# Patient Record
Sex: Female | Born: 1990 | Race: Black or African American | Hispanic: No | Marital: Single | Smoking: Former smoker
Health system: Southern US, Community
[De-identification: ages and names within clinical notes are randomized; demographics above are authoritative.]

## PROBLEM LIST (undated history)

## (undated) DIAGNOSIS — O26899 Other specified pregnancy related conditions, unspecified trimester: Secondary | ICD-10-CM

## (undated) DIAGNOSIS — L732 Hidradenitis suppurativa: Secondary | ICD-10-CM

## (undated) DIAGNOSIS — IMO0002 Reserved for concepts with insufficient information to code with codable children: Secondary | ICD-10-CM

## (undated) DIAGNOSIS — Z973 Presence of spectacles and contact lenses: Secondary | ICD-10-CM

## (undated) DIAGNOSIS — G43909 Migraine, unspecified, not intractable, without status migrainosus: Secondary | ICD-10-CM

## (undated) DIAGNOSIS — R12 Heartburn: Secondary | ICD-10-CM

## (undated) HISTORY — DX: Presence of spectacles and contact lenses: Z97.3

## (undated) HISTORY — DX: Migraine, unspecified, not intractable, without status migrainosus: G43.909

---

## 2009-09-06 ENCOUNTER — Emergency Department (HOSPITAL_COMMUNITY): Admission: EM | Admit: 2009-09-06 | Discharge: 2009-09-06 | Payer: Self-pay | Admitting: Family Medicine

## 2009-09-06 ENCOUNTER — Emergency Department (HOSPITAL_COMMUNITY): Admission: EM | Admit: 2009-09-06 | Discharge: 2009-09-07 | Payer: Self-pay | Admitting: Emergency Medicine

## 2010-10-05 LAB — POCT URINALYSIS DIP (DEVICE)
Glucose, UA: NEGATIVE mg/dL
Ketones, ur: >=160 mg/dL — AB
Nitrite: NEGATIVE
Specific Gravity, Urine: 1.02 (ref 1.005–1.030)
Urobilinogen, UA: 1 mg/dL (ref 0.0–1.0)

## 2010-10-05 LAB — WET PREP, GENITAL: Trich, Wet Prep: NONE SEEN

## 2010-10-05 LAB — ABO/RH: ABO/RH(D): B POS

## 2010-10-05 LAB — CBC: MCV: 89.5 fL (ref 78.0–100.0)

## 2010-10-05 LAB — GC/CHLAMYDIA PROBE AMP, GENITAL
Chlamydia, DNA Probe: NEGATIVE
GC Probe Amp, Genital: NEGATIVE

## 2010-10-05 LAB — POCT RAPID STREP A (OFFICE): Streptococcus, Group A Screen (Direct): POSITIVE — AB

## 2013-05-12 ENCOUNTER — Ambulatory Visit (INDEPENDENT_AMBULATORY_CARE_PROVIDER_SITE_OTHER): Payer: PRIVATE HEALTH INSURANCE | Admitting: Medical

## 2013-05-12 ENCOUNTER — Encounter: Payer: Self-pay | Admitting: Medical

## 2013-05-12 VITALS — BP 110/78 | HR 68 | Temp 97.9°F | Resp 16 | Wt 154.0 lb

## 2013-05-12 DIAGNOSIS — N939 Abnormal uterine and vaginal bleeding, unspecified: Secondary | ICD-10-CM

## 2013-05-12 DIAGNOSIS — Z3201 Encounter for pregnancy test, result positive: Secondary | ICD-10-CM

## 2013-05-12 DIAGNOSIS — N898 Other specified noninflammatory disorders of vagina: Secondary | ICD-10-CM

## 2013-05-12 DIAGNOSIS — N926 Irregular menstruation, unspecified: Secondary | ICD-10-CM

## 2013-05-12 DIAGNOSIS — R112 Nausea with vomiting, unspecified: Secondary | ICD-10-CM

## 2013-05-12 LAB — POCT URINE PREGNANCY: Preg Test, Ur: POSITIVE

## 2013-05-12 LAB — POCT URINALYSIS DIPSTICK
Bilirubin, UA: NEGATIVE
Glucose, UA: 100
Spec Grav, UA: 1.02

## 2013-05-12 MED ORDER — PRENATAL MULTIVITAMIN CH: 1.0000 | ORAL_TABLET | Freq: Every day | ORAL | Status: DC

## 2013-05-12 NOTE — Addendum Note (Signed)
Addended by: Leretha Dykes L on: 05/12/2013 12:03 PM   Modules accepted: Orders

## 2013-05-12 NOTE — Progress Notes (Signed)
Subjective:  Candace Pearson is a 22 y.o. female who presents as a new patient.  She is accompanied today by numerous family members including her fianc of several years. She is concerned that she may be pregnant. She took a home pregnancy test last week and it was positive .  She is not completely sure of her last menstrual period, believes it was about a month ago.  Current symptoms include nausea, morning sickness feeling, vomiting, breast tenderness, some weight gain, and some slight vaginal spotting.  She is sexually active.  2 months ago she was having some heavy menstrual bleeding with clots, thinks she may have uterine fibroids, although she has never been diagnosed or had an ultrasound. Otherwise has been in her usual state of health.  She notes a history of 2 prior pregnancies, both abortions, one D&C, the other through tablet medication.  However at this time she is excited about potentially being pregnant, plans to keep this child.  She is in school at [employer], Junior year. She is originally from [place], lived in [place] for a while. No other aggravating or relieving factors.    No other c/o.  The following portions of the patient's history were reviewed and updated as appropriate: allergies, current medications, past family history, past medical history, past social history, past surgical history and problem list.  ROS Otherwise as in subjective above  Objective: Physical Exam  BP 110/78  Pulse 68  Temp(Src) 97.9 F (36.6 C) (Oral)  Resp 16  Wt 154 lb (69.854 kg)  General appearance: alert, no distress, WD/WN, pleasant AA female Neck: supple, no lymphadenopathy, no thyromegaly, no masses Heart: RRR, normal S1, S2, no murmurs Lungs: CTA bilaterally, no wheezes, rhonchi, or rales Abdomen: +bs, soft, mild generalized abdominal tenderness, non distended, no masses, no hepatomegaly, no splenomegaly Pulses: 2+ radial pulses, 2+ pedal pulses, normal cap refill Ext: no  edema   Assessment: Encounter Diagnoses  Name Primary?  . Pregnancy test positive Yes  . Missed period   . Nausea with vomiting   . Vaginal spotting     Plan: Urine pregnancy test positive here today.  At her request discussed results with patient and her family and friends who are present.  We will check a beta hCG. I advise she do all she can to keep herself healthy and avoid any harm to the developing embryo.   Advise she stop alcohol, marijuana, tobacco.  Advise healthy diet, begin prenatal vitamins, discussed food preparation, avoiding raw foods, processed meats, eating healthy frequent small meals, advise she avoid litter boxes, wash hands frequently use good hygiene in general.   Recommend she read the book "what to expect when you're expecting," discussed family support.  We referred her to obstetrician, and I gave her these days today. Answered her questions. Handout given.

## 2013-05-12 NOTE — Patient Instructions (Signed)

## 2013-06-02 LAB — OB RESULTS CONSOLE HEPATITIS B SURFACE ANTIGEN: HEP B S AG: NEGATIVE

## 2013-06-02 LAB — OB RESULTS CONSOLE ABO/RH: RH Type: POSITIVE

## 2013-06-02 LAB — OB RESULTS CONSOLE HIV ANTIBODY (ROUTINE TESTING): HIV: NONREACTIVE

## 2013-06-02 LAB — OB RESULTS CONSOLE ANTIBODY SCREEN: ANTIBODY SCREEN: NEGATIVE

## 2013-06-02 LAB — OB RESULTS CONSOLE RUBELLA ANTIBODY, IGM: RUBELLA: IMMUNE

## 2013-06-04 ENCOUNTER — Telehealth: Payer: Self-pay | Admitting: Internal Medicine

## 2013-06-04 NOTE — Telephone Encounter (Signed)
Faxed over medical records to [facility] on 11/20

## 2013-09-29 ENCOUNTER — Emergency Department (HOSPITAL_COMMUNITY)
Admission: EM | Admit: 2013-09-29 | Discharge: 2013-09-30 | Disposition: A | Discharge status: Home / Self Care | Payer: PPO | Attending: Emergency Medicine | Admitting: Emergency Medicine

## 2013-09-29 ENCOUNTER — Encounter (HOSPITAL_COMMUNITY): Payer: Self-pay | Admitting: Emergency Medicine

## 2013-09-29 DIAGNOSIS — IMO0002 Reserved for concepts with insufficient information to code with codable children: Secondary | ICD-10-CM | POA: Insufficient documentation

## 2013-09-29 DIAGNOSIS — Z8679 Personal history of other diseases of the circulatory system: Secondary | ICD-10-CM | POA: Insufficient documentation

## 2013-09-29 DIAGNOSIS — L02412 Cutaneous abscess of left axilla: Secondary | ICD-10-CM

## 2013-09-29 DIAGNOSIS — L732 Hidradenitis suppurativa: Secondary | ICD-10-CM | POA: Insufficient documentation

## 2013-09-29 DIAGNOSIS — O9989 Other specified diseases and conditions complicating pregnancy, childbirth and the puerperium: Secondary | ICD-10-CM | POA: Insufficient documentation

## 2013-09-29 NOTE — ED Notes (Addendum)
Pt in c/o abscess to left axillary area, increased pain and swelling today, states pain is radiating into her left breast and down her side now, no redness noted beyond site of abscess- pt is also [redacted] weeks pregnant, denies problems with pregnancy, no vaginal discharge or bleeding, denies abdominal pain

## 2013-09-29 NOTE — ED Notes (Signed)
Fetal heart tones obtained at triage, range of 155-165 noted.

## 2013-09-30 MED ORDER — ACETAMINOPHEN 325 MG PO TABS
650.0000 mg | ORAL_TABLET | Freq: Once | ORAL | Status: AC
  Administered 2013-09-30: 650 mg via ORAL
  Filled 2013-09-30: qty 2

## 2013-09-30 MED ORDER — LIDOCAINE HCL (PF) 1 % IJ SOLN
5.0000 mL | Freq: Once | INTRAMUSCULAR | Status: DC
  Filled 2013-09-30: qty 5

## 2013-09-30 NOTE — Discharge Instructions (Signed)
Dressing can be removed in the morning.  Shower twice daily with antibacterial soap.  Gently let the water run over the area.  Do not scrub.  Followup in the emergency department, urgent care or with your primary care Dr. for recheck in 2 days.  Return sooner for signs of infection: Redness, worsening swelling, fever.  Return also for other new concerning symptoms.  You may take Tylenol for pain.     Abscess Care After An abscess (also called a boil or furuncle) is an infected area that contains a collection of pus. Signs and symptoms of an abscess include pain, tenderness, redness, or hardness, or you may feel a moveable soft area under your skin. An abscess can occur anywhere in the body. The infection may spread to surrounding tissues causing cellulitis. A cut (incision) by the surgeon was made over your abscess and the pus was drained out. Gauze may have been packed into the space to provide a drain that will allow the cavity to heal from the inside outwards. The boil may be painful for 5 to 7 days. Most people with a boil do not have high fevers. Your abscess, if seen early, may not have localized, and may not have been lanced. If not, another appointment may be required for this if it does not get better on its own or with medications. HOME CARE INSTRUCTIONS   Only take over-the-counter or prescription medicines for pain, discomfort, or fever as directed by your caregiver.  When you bathe, soak and then remove gauze or iodoform packs at least daily or as directed by your caregiver. You may then wash the wound gently with mild soapy water. Repack with gauze or do as your caregiver directs. SEEK IMMEDIATE MEDICAL CARE IF:   You develop increased pain, swelling, redness, drainage, or bleeding in the wound site.  You develop signs of generalized infection including muscle aches, chills, fever, or a general ill feeling.  An oral temperature above 102 F (38.9 C) develops, not controlled by  medication. See your caregiver for a recheck if you develop any of the symptoms described above. If medications (antibiotics) were prescribed, take them as directed. Document Released: 01/17/2005 Document Revised: 09/23/2011 Document Reviewed: 09/14/2007 [facility] Patient Information 2014 [place].  Hidradenitis Suppurativa, Sweat Gland Abscess Hidradenitis suppurativa is a long lasting (chronic), uncommon disease of the sweat glands. With this, boil-like lumps and scarring develop in the groin, some times under the arms (axillae), and under the breasts. It may also uncommonly occur behind the ears, in the crease of the buttocks, and around the genitals.  CAUSES  The cause is from a blocking of the sweat glands. They then become infected. It may cause drainage and odor. It is not contagious. So it cannot be given to someone else. It most often shows up in puberty (about 3010 to 25 years of age). But it may happen much later. It is similar to acne which is a disease of the sweat glands. This condition is slightly more common in African-Americans and women. SYMPTOMS   Hidradenitis usually starts as one or more red, tender, swellings in the groin or under the arms (axilla).  Over a period of hours to days the lesions get larger. They often open to the skin surface, draining clear to yellow-colored fluid.  The infected area heals with scarring. DIAGNOSIS  Your caregiver makes this diagnosis by looking at you. Sometimes cultures (growing germs on plates in the lab) may be taken. This is to see  what germ (bacterium) is causing the infection.  TREATMENT   Topical germ killing medicine applied to the skin (antibiotics) are the treatment of choice. Antibiotics taken by mouth (systemic) are sometimes needed when the condition is getting worse or is severe.  Avoid tight-fitting clothing which traps moisture in.  Dirt does not cause hidradenitis and it is not caused by poor hygiene.  Involved areas  should be cleaned daily using an antibacterial soap. Some patients find that the liquid form of Lever 2000, applied to the involved areas as a lotion after bathing, can help reduce the odor related to this condition.  Sometimes surgery is needed to drain infected areas or remove scarred tissue. Removal of large amounts of tissue is used only in severe cases.  Birth control pills may be helpful.  Oral retinoids (vitamin A derivatives) for 6 to 12 months which are effective for acne may also help this condition.  Weight loss will improve but not cure hidradenitis. It is made worse by being overweight. But the condition is not caused by being overweight.  This condition is more common in people who have had acne.  It may become worse under stress. There is no medical cure for hidradenitis. It can be controlled, but not cured. The condition usually continues for years with periods of getting worse and getting better (remission). Document Released: 02/13/2004 Document Revised: 09/23/2011 Document Reviewed: 02/29/2008 [facility] Patient Information 2014 [place], [place].

## 2013-09-30 NOTE — ED Provider Notes (Signed)
CSN: [id number]     Arrival date & time 09/29/13  1853 History   First MD Initiated Contact with Patient 09/30/13 0016     Chief Complaint  Patient presents with  . Abscess     (Consider location/radiation/quality/duration/timing/severity/associated sxs/prior Treatment) HPI 23 year old female presents to the emergency department with complaint of left axillary abscess.  She reports swelling to the area.  Over the last several weeks, over the last 5 days.  Has gotten larger and more tender.  She has been doing warm compresses without improvement.  No prior history of hidradenitis.  Patient is currently [redacted] weeks pregnant.  She reports no problems with the pregnancy.  The pain in axilla radiates down her left arm and into her breast.  No fevers or chills. Past Medical History  Diagnosis Date  . Migraine   . Wears glasses    History reviewed. No pertinent past surgical history. History reviewed. No pertinent family history. History  Substance Use Topics  . Smoking status: Never Smoker   . Smokeless tobacco: Not on file  . Alcohol Use: Not on file     Comment: usually mixture of alcohol, but none currently   OB History   Grav Para Term Preterm Abortions TAB SAB Ect Mult Living                 Review of Systems  See History of Present Illness; otherwise all other systems are reviewed and negative   Allergies  Review of patient's allergies indicates no known allergies.  Home Medications   Current Outpatient Rx  Name  Route  Sig  Dispense  Refill  . Prenatal Vit-Fe Fumarate-FA (PRENATAL MULTIVITAMIN) TABS tablet   Oral   Take 1 tablet by mouth daily at 12 noon.   30 tablet   11    BP 108/72  Pulse 86  Temp(Src) 98.1 F (36.7 C) (Oral)  Resp 18  SpO2 100% Physical Exam  Nursing note and vitals reviewed. Constitutional: She is oriented to person, place, and time. She appears well-developed and well-nourished. She appears distressed (uncomfortable appearing).  HENT:   Head: Normocephalic and atraumatic.  Nose: Nose normal.  Mouth/Throat: Oropharynx is clear and moist.  Eyes: Conjunctivae and EOM are normal. Pupils are equal, round, and reactive to light.  Neck: Normal range of motion. Neck supple. No JVD present. No tracheal deviation present. No thyromegaly present.  Cardiovascular: Normal rate, regular rhythm, normal heart sounds and intact distal pulses.  Exam reveals no gallop and no friction rub.   No murmur heard. Pulmonary/Chest: Effort normal and breath sounds normal. No stridor. No respiratory distress. She has no wheezes. She has no rales. She exhibits no tenderness.  Abdominal: Soft. Bowel sounds are normal. She exhibits no distension and no mass. There is no tenderness. There is no rebound and no guarding.  Gravid uterus  Musculoskeletal: Normal range of motion. She exhibits tenderness. She exhibits no edema.  Lymphadenopathy:    She has no cervical adenopathy.  Neurological: She is alert and oriented to person, place, and time. She exhibits normal muscle tone. Coordination normal.  Skin: Skin is warm and dry. No rash noted. No erythema. No pallor.  Patient has skin changes in left axilla, consistent with hidradenitis.  She has a large, fluctuant area in the center of her axilla with pointing laterally.  This area is very tender to palpation.  There is no surrounding erythema or warmth outside of the fluctuant area.  Psychiatric: She has a normal mood  and affect. Her behavior is normal. Judgment and thought content normal.    ED Course  Procedures (including critical care time) Labs Review Labs Reviewed  CULTURE, ROUTINE-ABSCESS   Imaging Review No results found.   EKG Interpretation None      INCISION AND DRAINAGE Performed by: Olivia MackieTTER,Brode Sculley M Consent: Verbal consent obtained. Risks and benefits: risks, benefits and alternatives were discussed Type: abscess  Body area: left axilla  Anesthesia: local infiltration  Incision was  made with a scalpel.  Local anesthetic: lidocaine 1% without epinephrine  Anesthetic total: 5 ml  Complexity: complex Blunt dissection to break up loculations  Drainage: purulent  Drainage amount: moderate  Packing material: none  Patient tolerance: Patient tolerated the procedure well with no immediate complications.     MDM   Final diagnoses:  Abscess of axilla, left  Hidradenitis    23 year old female with left axilla abscess.  I suspect patient does have some underlying hidradenitis.  Patient advised that after her pregnancy, if she continues having problems.  She should call for general surgery for further evaluation.      Olivia Mackielga M Arther Heisler, MD 09/30/13 [phone]

## 2013-10-02 ENCOUNTER — Encounter (HOSPITAL_COMMUNITY): Payer: Self-pay | Admitting: Emergency Medicine

## 2013-10-02 ENCOUNTER — Emergency Department (INDEPENDENT_AMBULATORY_CARE_PROVIDER_SITE_OTHER)
Admission: EM | Admit: 2013-10-02 | Discharge: 2013-10-02 | Disposition: A | Discharge status: Home / Self Care | Payer: 59 | Source: Home / Self Care | Attending: Family Medicine | Admitting: Family Medicine

## 2013-10-02 DIAGNOSIS — IMO0002 Reserved for concepts with insufficient information to code with codable children: Secondary | ICD-10-CM

## 2013-10-02 DIAGNOSIS — L02419 Cutaneous abscess of limb, unspecified: Secondary | ICD-10-CM

## 2013-10-02 DIAGNOSIS — Z5189 Encounter for other specified aftercare: Secondary | ICD-10-CM

## 2013-10-02 HISTORY — DX: Hidradenitis suppurativa: L73.2

## 2013-10-02 MED ORDER — SODIUM CHLORIDE 0.9 % IJ SOLN
INTRAMUSCULAR | Status: AC
  Filled 2013-10-02: qty 10

## 2013-10-02 NOTE — ED Notes (Signed)
Was seen in ED 2 days ago for left axillary abscess - had I&D with packing placed.  Was not placed on abx.  Pt is 6 months pregnant.  Has not been taking anything for pain.  C/O significant burning & pain to area.

## 2013-10-02 NOTE — Discharge Instructions (Signed)
Abscess  Care After  An abscess (also called a boil or furuncle) is an infected area that contains a collection of pus. Signs and symptoms of an abscess include pain, tenderness, redness, or hardness, or you may feel a moveable soft area under your skin. An abscess can occur anywhere in the body. The infection may spread to surrounding tissues causing cellulitis. A cut (incision) by the surgeon was made over your abscess and the pus was drained out. Gauze may have been packed into the space to provide a drain that will allow the cavity to heal from the inside outwards. The boil may be painful for 5 to 7 days. Most people with a boil do not have high fevers. Your abscess, if seen early, may not have localized, and may not have been lanced. If not, another appointment may be required for this if it does not get better on its own or with medications.  HOME CARE INSTRUCTIONS   · Only take over-the-counter or prescription medicines for pain, discomfort, or fever as directed by your caregiver.  · When you bathe, soak and then remove gauze or iodoform packs at least daily or as directed by your caregiver. You may then wash the wound gently with mild soapy water. Repack with gauze or do as your caregiver directs.  SEEK IMMEDIATE MEDICAL CARE IF:   · You develop increased pain, swelling, redness, drainage, or bleeding in the wound site.  · You develop signs of generalized infection including muscle aches, chills, fever, or a general ill feeling.  · An oral temperature above 102° F (38.9° C) develops, not controlled by medication.  See your caregiver for a recheck if you develop any of the symptoms described above. If medications (antibiotics) were prescribed, take them as directed.  Document Released: 01/17/2005 Document Revised: 09/23/2011 Document Reviewed: 09/14/2007  Exit[employer]® Patient Information ©2014 [employer], [employer].

## 2013-10-02 NOTE — ED Provider Notes (Signed)
CSN: [id number]     Arrival date & time 10/02/13  1001 History   First MD Initiated Contact with Patient 10/02/13 1036     Chief Complaint  Patient presents with  . Wound Check   (Consider location/radiation/quality/duration/timing/severity/associated sxs/prior Treatment) HPI Comments: 23 year old female presents for wound check for a left axillary abscess. She had this drained in the emergency Department couple of days ago. The pain is much better since getting this drained. Denies any systemic symptoms. She notes she is currently 6 months pregnant.  Patient is a 23 y.o. female presenting with wound check.  Wound Check Pertinent negatives include no chest pain, no abdominal pain and no shortness of breath.    Past Medical History  Diagnosis Date  . Migraine   . Wears glasses   . Hidradenitis    History reviewed. No pertinent past surgical history. No family history on file. History  Substance Use Topics  . Smoking status: Never Smoker   . Smokeless tobacco: Not on file  . Alcohol Use: No   OB History   Grav Para Term Preterm Abortions TAB SAB Ect Mult Living   1              Review of Systems  Constitutional: Negative for fever and chills.  Eyes: Negative for visual disturbance.  Respiratory: Negative for cough and shortness of breath.   Cardiovascular: Negative for chest pain, palpitations and leg swelling.  Gastrointestinal: Negative for nausea, vomiting and abdominal pain.  Endocrine: Negative for polydipsia and polyuria.  Genitourinary: Negative for dysuria, urgency and frequency.  Musculoskeletal: Negative for arthralgias and myalgias.  Skin: Positive for wound. Negative for rash.  Neurological: Negative for dizziness, weakness and light-headedness.    Allergies  Review of patient's allergies indicates no known allergies.  Home Medications   Current Outpatient Rx  Name  Route  Sig  Dispense  Refill  . Prenatal Vit-Fe Fumarate-FA (PRENATAL MULTIVITAMIN) TABS  tablet   Oral   Take 1 tablet by mouth daily at 12 noon.   30 tablet   11    BP 117/79  Pulse 85  Temp(Src) 98.4 F (36.9 C) (Oral)  Resp 16  SpO2 99% Physical Exam  Nursing note and vitals reviewed. Constitutional: She is oriented to person, place, and time. Vital signs are normal. She appears well-developed and well-nourished. No distress.  HENT:  Head: Normocephalic and atraumatic.  Pulmonary/Chest: Effort normal. No respiratory distress.  Neurological: She is alert and oriented to person, place, and time. She has normal strength. Coordination normal.  Skin: Skin is warm and dry. No rash noted. She is not diaphoretic.  Abscess in the left axilla, wound dressed, with bloody drainage, with very small wick placed into the abscess cavity. There is minimal induration superiorly and medially to the incision. No fluctuance or further purulent drainage. No erythema surrounding this.  Psychiatric: She has a normal mood and affect. Judgment normal.    ED Course  Procedures (including critical care time) Labs Review Labs Reviewed - No data to display Imaging Review No results found.   MDM   1. Abscess, axilla   2. Wound check, abscess    Patient is afebrile and nontoxic, which improved her symptoms and drainage. Very minimal abscess cavity. Will not repack this. Warm soaks at least 3-4 times daily, followup immediately if any worsening.       Graylon GoodZachary H Keena Heesch, PA-C 10/02/13 1054

## 2013-10-03 LAB — CULTURE, ROUTINE-ABSCESS

## 2013-10-04 NOTE — ED Provider Notes (Signed)
Medical screening examination/treatment/procedure(s) were performed by a resident physician or non-physician practitioner and as the supervising physician I was immediately available for consultation/collaboration.  Shanyla Marconi, MD    Cina Klumpp S Orvie Caradine, MD 10/04/13 0802 

## 2013-11-11 ENCOUNTER — Other Ambulatory Visit: Payer: Self-pay | Admitting: Obstetrics and Gynecology

## 2013-11-11 DIAGNOSIS — O358XX0 Maternal care for other (suspected) fetal abnormality and damage, not applicable or unspecified: Secondary | ICD-10-CM

## 2013-11-23 ENCOUNTER — Ambulatory Visit (HOSPITAL_COMMUNITY): Payer: 59

## 2013-12-03 ENCOUNTER — Ambulatory Visit (HOSPITAL_COMMUNITY)
Admission: RE | Admit: 2013-12-03 | Discharge: 2013-12-03 | Disposition: A | Discharge status: Home / Self Care | Payer: 59 | Source: Ambulatory Visit | Attending: Obstetrics and Gynecology | Admitting: Obstetrics and Gynecology

## 2013-12-03 ENCOUNTER — Encounter (HOSPITAL_COMMUNITY): Payer: Self-pay

## 2013-12-03 ENCOUNTER — Other Ambulatory Visit: Payer: Self-pay | Admitting: Obstetrics and Gynecology

## 2013-12-03 ENCOUNTER — Ambulatory Visit (HOSPITAL_COMMUNITY): Admission: RE | Admit: 2013-12-03 | Payer: 59 | Source: Ambulatory Visit

## 2013-12-03 DIAGNOSIS — O358XX0 Maternal care for other (suspected) fetal abnormality and damage, not applicable or unspecified: Secondary | ICD-10-CM | POA: Insufficient documentation

## 2013-12-23 ENCOUNTER — Encounter (HOSPITAL_COMMUNITY): Payer: Self-pay | Admitting: Pharmacist

## 2013-12-23 ENCOUNTER — Other Ambulatory Visit: Payer: Self-pay | Admitting: Obstetrics and Gynecology

## 2013-12-24 ENCOUNTER — Encounter (HOSPITAL_COMMUNITY): Payer: Self-pay

## 2013-12-24 NOTE — Patient Instructions (Addendum)
   Your procedure is scheduled on:  Tuesday,  June 15  [facility] at:  10 AM Pick up the phone at the desk and dial [phone] and inform us of your arrival.  Please call this number if you have any problems the morning of surgery: [phone]  Remember: Do not eat food after midnight: Monday Do not drink clear liquids after: 7 AM Tuesday Take these medicines the morning of surgery with a SIP OF WATER:  None  Do not wear jewelry, make-up, or FINGER nail polish No metal in your hair or on your body. Do not wear lotions, powders, perfumes.  You may wear deodorant.  Do not bring valuables to the hospital. Contacts, dentures or bridgework may not be worn into surgery.  Leave suitcase in the car. After Surgery it may be brought to your room. For patients being admitted to the hospital, checkout time is 11:00am the day of discharge.  Home with FOB Sean cell [phone].

## 2013-12-27 ENCOUNTER — Encounter (HOSPITAL_COMMUNITY): Payer: Self-pay

## 2013-12-27 ENCOUNTER — Encounter (HOSPITAL_COMMUNITY)
Admission: RE | Admit: 2013-12-27 | Discharge: 2013-12-27 | Disposition: A | Discharge status: Home / Self Care | Payer: 59 | Source: Ambulatory Visit | Attending: Obstetrics and Gynecology | Admitting: Obstetrics and Gynecology

## 2013-12-27 HISTORY — DX: Heartburn: R12

## 2013-12-27 HISTORY — DX: Other specified pregnancy related conditions, unspecified trimester: O26.899

## 2013-12-27 HISTORY — DX: Reserved for concepts with insufficient information to code with codable children: IMO0002

## 2013-12-27 LAB — TYPE AND SCREEN
ABO/RH(D): B POS
ANTIBODY SCREEN: NEGATIVE

## 2013-12-27 LAB — CBC
HCT: 36 % (ref 36.0–46.0)
Hemoglobin: 12.4 g/dL (ref 12.0–15.0)
MCH: 31.4 pg (ref 26.0–34.0)
MCHC: 34.4 g/dL (ref 30.0–36.0)
MCV: 91.1 fL (ref 78.0–100.0)
PLATELETS: 179 10*3/uL (ref 150–400)
RBC: 3.95 MIL/uL (ref 3.87–5.11)
RDW: 12.9 % (ref 11.5–15.5)
WBC: 9 10*3/uL (ref 4.0–10.5)

## 2013-12-27 LAB — ABO/RH: ABO/RH(D): B POS

## 2013-12-27 LAB — RPR

## 2013-12-28 ENCOUNTER — Inpatient Hospital Stay (HOSPITAL_COMMUNITY): Payer: 59 | Admitting: Anesthesiology

## 2013-12-28 ENCOUNTER — Encounter (HOSPITAL_COMMUNITY)
Admission: RE | Disposition: A | Discharge status: Home / Self Care | Payer: Self-pay | Source: Ambulatory Visit | Attending: Obstetrics and Gynecology

## 2013-12-28 ENCOUNTER — Inpatient Hospital Stay (HOSPITAL_COMMUNITY)
Admission: RE | Admit: 2013-12-28 | Discharge: 2013-12-31 | DRG: 766 | Disposition: A | Discharge status: Home / Self Care | Payer: 59 | Source: Ambulatory Visit | Attending: Obstetrics and Gynecology | Admitting: Obstetrics and Gynecology

## 2013-12-28 ENCOUNTER — Encounter (HOSPITAL_COMMUNITY): Payer: Self-pay | Admitting: *Deleted

## 2013-12-28 ENCOUNTER — Encounter (HOSPITAL_COMMUNITY): Payer: 59 | Admitting: Anesthesiology

## 2013-12-28 DIAGNOSIS — O99344 Other mental disorders complicating childbirth: Secondary | ICD-10-CM | POA: Diagnosis present

## 2013-12-28 DIAGNOSIS — O26849 Uterine size-date discrepancy, unspecified trimester: Secondary | ICD-10-CM | POA: Diagnosis present

## 2013-12-28 DIAGNOSIS — Z87891 Personal history of nicotine dependence: Secondary | ICD-10-CM

## 2013-12-28 DIAGNOSIS — O321XX0 Maternal care for breech presentation, not applicable or unspecified: Principal | ICD-10-CM | POA: Diagnosis present

## 2013-12-28 DIAGNOSIS — Z98891 History of uterine scar from previous surgery: Secondary | ICD-10-CM

## 2013-12-28 DIAGNOSIS — M4120 Other idiopathic scoliosis, site unspecified: Secondary | ICD-10-CM | POA: Diagnosis present

## 2013-12-28 DIAGNOSIS — F1210 Cannabis abuse, uncomplicated: Secondary | ICD-10-CM | POA: Diagnosis present

## 2013-12-28 DIAGNOSIS — O358XX0 Maternal care for other (suspected) fetal abnormality and damage, not applicable or unspecified: Secondary | ICD-10-CM | POA: Diagnosis present

## 2013-12-28 SURGERY — Surgical Case
Anesthesia: Spinal

## 2013-12-28 MED ORDER — PHENYLEPHRINE 8 MG IN D5W 100 ML (0.08MG/ML) PREMIX OPTIME
INJECTION | INTRAVENOUS | Status: DC | PRN
  Administered 2013-12-28: 60 ug/min via INTRAVENOUS

## 2013-12-28 MED ORDER — BUPIVACAINE IN DEXTROSE 0.75-8.25 % IT SOLN
INTRATHECAL | Status: DC | PRN
  Administered 2013-12-28: 1.6 mL via INTRATHECAL

## 2013-12-28 MED ORDER — PROMETHAZINE HCL 25 MG/ML IJ SOLN: 6.2500 mg | INTRAMUSCULAR | Status: DC | PRN

## 2013-12-28 MED ORDER — OXYCODONE-ACETAMINOPHEN 5-325 MG PO TABS
1.0000 | ORAL_TABLET | ORAL | Status: DC | PRN
  Administered 2013-12-29 – 2013-12-30 (×7): 1 via ORAL
  Administered 2013-12-31 (×3): 2 via ORAL
  Filled 2013-12-28 (×4): qty 1
  Filled 2013-12-28: qty 2
  Filled 2013-12-28: qty 1
  Filled 2013-12-28: qty 2
  Filled 2013-12-28 (×4): qty 1

## 2013-12-28 MED ORDER — MORPHINE SULFATE 0.5 MG/ML IJ SOLN
INTRAMUSCULAR | Status: AC
  Filled 2013-12-28: qty 10

## 2013-12-28 MED ORDER — DIPHENHYDRAMINE HCL 25 MG PO CAPS: 25.0000 mg | ORAL_CAPSULE | ORAL | Status: DC | PRN

## 2013-12-28 MED ORDER — FERROUS SULFATE 325 (65 FE) MG PO TABS
325.0000 mg | ORAL_TABLET | Freq: Two times a day (BID) | ORAL | Status: DC
  Administered 2013-12-29 – 2013-12-30 (×4): 325 mg via ORAL
  Filled 2013-12-28 (×4): qty 1

## 2013-12-28 MED ORDER — SCOPOLAMINE 1 MG/3DAYS TD PT72: 1.0000 | MEDICATED_PATCH | Freq: Once | TRANSDERMAL | Status: DC

## 2013-12-28 MED ORDER — FLEET ENEMA 7-19 GM/118ML RE ENEM: 1.0000 | ENEMA | Freq: Every day | RECTAL | Status: DC | PRN

## 2013-12-28 MED ORDER — MIDAZOLAM HCL 2 MG/2ML IJ SOLN: 0.5000 mg | Freq: Once | INTRAMUSCULAR | Status: DC | PRN

## 2013-12-28 MED ORDER — PRENATAL MULTIVITAMIN CH
1.0000 | ORAL_TABLET | Freq: Every day | ORAL | Status: DC
  Administered 2013-12-29 – 2013-12-30 (×2): 1 via ORAL
  Filled 2013-12-28 (×2): qty 1

## 2013-12-28 MED ORDER — MEPERIDINE HCL 25 MG/ML IJ SOLN: 6.2500 mg | INTRAMUSCULAR | Status: DC | PRN

## 2013-12-28 MED ORDER — KETOROLAC TROMETHAMINE 30 MG/ML IJ SOLN
30.0000 mg | Freq: Four times a day (QID) | INTRAMUSCULAR | Status: AC | PRN
  Administered 2013-12-28: 30 mg via INTRAVENOUS

## 2013-12-28 MED ORDER — NALBUPHINE HCL 10 MG/ML IJ SOLN: 5.0000 mg | INTRAMUSCULAR | Status: DC | PRN

## 2013-12-28 MED ORDER — DIPHENHYDRAMINE HCL 50 MG/ML IJ SOLN: 12.5000 mg | INTRAMUSCULAR | Status: DC | PRN

## 2013-12-28 MED ORDER — WITCH HAZEL-GLYCERIN EX PADS: 1.0000 "application " | MEDICATED_PAD | CUTANEOUS | Status: DC | PRN

## 2013-12-28 MED ORDER — IBUPROFEN 600 MG PO TABS
600.0000 mg | ORAL_TABLET | Freq: Four times a day (QID) | ORAL | Status: DC
  Administered 2013-12-28 – 2013-12-31 (×10): 600 mg via ORAL
  Filled 2013-12-28 (×10): qty 1

## 2013-12-28 MED ORDER — FENTANYL CITRATE 0.05 MG/ML IJ SOLN
INTRAMUSCULAR | Status: AC
  Filled 2013-12-28: qty 2

## 2013-12-28 MED ORDER — FENTANYL CITRATE 0.05 MG/ML IJ SOLN
INTRAMUSCULAR | Status: DC | PRN
  Administered 2013-12-28: 25 ug via INTRATHECAL

## 2013-12-28 MED ORDER — ONDANSETRON HCL 4 MG PO TABS: 4.0000 mg | ORAL_TABLET | ORAL | Status: DC | PRN

## 2013-12-28 MED ORDER — CEFAZOLIN SODIUM-DEXTROSE 2-3 GM-% IV SOLR
2.0000 g | INTRAVENOUS | Status: AC
  Administered 2013-12-28: 2 g via INTRAVENOUS

## 2013-12-28 MED ORDER — KETOROLAC TROMETHAMINE 30 MG/ML IJ SOLN
INTRAMUSCULAR | Status: AC
Start: 2013-12-28 — End: 2013-12-28
  Administered 2013-12-28: 30 mg via INTRAVENOUS
  Filled 2013-12-28: qty 1

## 2013-12-28 MED ORDER — IBUPROFEN 600 MG PO TABS: 600.0000 mg | ORAL_TABLET | Freq: Four times a day (QID) | ORAL | Status: DC | PRN

## 2013-12-28 MED ORDER — NALOXONE HCL 1 MG/ML IJ SOLN: 1.0000 ug/kg/h | INTRAVENOUS | Status: DC | PRN

## 2013-12-28 MED ORDER — 0.9 % SODIUM CHLORIDE (POUR BTL) OPTIME
TOPICAL | Status: DC | PRN
  Administered 2013-12-28: 1000 mL

## 2013-12-28 MED ORDER — KETOROLAC TROMETHAMINE 30 MG/ML IJ SOLN: 30.0000 mg | Freq: Four times a day (QID) | INTRAMUSCULAR | Status: AC | PRN

## 2013-12-28 MED ORDER — ONDANSETRON HCL 4 MG/2ML IJ SOLN
INTRAMUSCULAR | Status: DC | PRN
  Administered 2013-12-28: 4 mg via INTRAVENOUS

## 2013-12-28 MED ORDER — LACTATED RINGERS IV SOLN
INTRAVENOUS | Status: DC
  Administered 2013-12-28 (×3): via INTRAVENOUS

## 2013-12-28 MED ORDER — SCOPOLAMINE 1 MG/3DAYS TD PT72
1.0000 | MEDICATED_PATCH | Freq: Once | TRANSDERMAL | Status: AC
  Administered 2013-12-28: 1.5 mg via TRANSDERMAL

## 2013-12-28 MED ORDER — MORPHINE SULFATE (PF) 0.5 MG/ML IJ SOLN
INTRAMUSCULAR | Status: DC | PRN
  Administered 2013-12-28: .15 mg via EPIDURAL

## 2013-12-28 MED ORDER — ACETAMINOPHEN 500 MG PO TABS
1000.0000 mg | ORAL_TABLET | Freq: Four times a day (QID) | ORAL | Status: AC
  Administered 2013-12-28 – 2013-12-29 (×2): 1000 mg via ORAL
  Filled 2013-12-28 (×3): qty 2

## 2013-12-28 MED ORDER — LANOLIN HYDROUS EX OINT: 1.0000 "application " | TOPICAL_OINTMENT | CUTANEOUS | Status: DC | PRN

## 2013-12-28 MED ORDER — FENTANYL CITRATE 0.05 MG/ML IJ SOLN
25.0000 ug | INTRAMUSCULAR | Status: DC | PRN
  Administered 2013-12-28: 50 ug via INTRAVENOUS

## 2013-12-28 MED ORDER — ONDANSETRON HCL 4 MG/2ML IJ SOLN: 4.0000 mg | INTRAMUSCULAR | Status: DC | PRN

## 2013-12-28 MED ORDER — SIMETHICONE 80 MG PO CHEW
80.0000 mg | CHEWABLE_TABLET | Freq: Three times a day (TID) | ORAL | Status: DC
  Administered 2013-12-28 – 2013-12-30 (×7): 80 mg via ORAL
  Filled 2013-12-28 (×7): qty 1

## 2013-12-28 MED ORDER — ONDANSETRON HCL 4 MG/2ML IJ SOLN
INTRAMUSCULAR | Status: AC
  Filled 2013-12-28: qty 2

## 2013-12-28 MED ORDER — SENNOSIDES-DOCUSATE SODIUM 8.6-50 MG PO TABS
2.0000 | ORAL_TABLET | ORAL | Status: DC
  Administered 2013-12-29 – 2013-12-30 (×3): 2 via ORAL
  Filled 2013-12-28 (×3): qty 2

## 2013-12-28 MED ORDER — DIPHENHYDRAMINE HCL 50 MG/ML IJ SOLN: 25.0000 mg | INTRAMUSCULAR | Status: DC | PRN

## 2013-12-28 MED ORDER — LACTATED RINGERS IV SOLN
INTRAVENOUS | Status: DC
  Administered 2013-12-29: 999 mL via INTRAVENOUS

## 2013-12-28 MED ORDER — SIMETHICONE 80 MG PO CHEW
80.0000 mg | CHEWABLE_TABLET | ORAL | Status: DC
  Administered 2013-12-29 – 2013-12-30 (×3): 80 mg via ORAL
  Filled 2013-12-28 (×3): qty 1

## 2013-12-28 MED ORDER — METOCLOPRAMIDE HCL 5 MG/ML IJ SOLN: 10.0000 mg | Freq: Three times a day (TID) | INTRAMUSCULAR | Status: DC | PRN

## 2013-12-28 MED ORDER — SIMETHICONE 80 MG PO CHEW: 80.0000 mg | CHEWABLE_TABLET | ORAL | Status: DC | PRN

## 2013-12-28 MED ORDER — MEASLES, MUMPS & RUBELLA VAC ~~LOC~~ INJ: 0.5000 mL | INJECTION | Freq: Once | SUBCUTANEOUS | Status: DC

## 2013-12-28 MED ORDER — DIPHENHYDRAMINE HCL 25 MG PO CAPS: 25.0000 mg | ORAL_CAPSULE | Freq: Four times a day (QID) | ORAL | Status: DC | PRN

## 2013-12-28 MED ORDER — SODIUM CHLORIDE 0.9 % IJ SOLN: 3.0000 mL | INTRAMUSCULAR | Status: DC | PRN

## 2013-12-28 MED ORDER — LACTATED RINGERS IV SOLN
Freq: Once | INTRAVENOUS | Status: AC
  Administered 2013-12-28: 11:00:00 via INTRAVENOUS

## 2013-12-28 MED ORDER — ONDANSETRON HCL 4 MG/2ML IJ SOLN: 4.0000 mg | Freq: Three times a day (TID) | INTRAMUSCULAR | Status: DC | PRN

## 2013-12-28 MED ORDER — DIBUCAINE 1 % RE OINT: 1.0000 "application " | TOPICAL_OINTMENT | RECTAL | Status: DC | PRN

## 2013-12-28 MED ORDER — TETANUS-DIPHTH-ACELL PERTUSSIS 5-2.5-18.5 LF-MCG/0.5 IM SUSP: 0.5000 mL | Freq: Once | INTRAMUSCULAR | Status: DC

## 2013-12-28 MED ORDER — OXYTOCIN 40 UNITS IN LACTATED RINGERS INFUSION - SIMPLE MED: 62.5000 mL/h | INTRAVENOUS | Status: AC

## 2013-12-28 MED ORDER — MENTHOL 3 MG MT LOZG: 1.0000 | LOZENGE | OROMUCOSAL | Status: DC | PRN

## 2013-12-28 MED ORDER — NALOXONE HCL 0.4 MG/ML IJ SOLN: 0.4000 mg | INTRAMUSCULAR | Status: DC | PRN

## 2013-12-28 MED ORDER — ZOLPIDEM TARTRATE 5 MG PO TABS: 5.0000 mg | ORAL_TABLET | Freq: Every evening | ORAL | Status: DC | PRN

## 2013-12-28 MED ORDER — OXYTOCIN 10 UNIT/ML IJ SOLN
INTRAMUSCULAR | Status: AC
  Filled 2013-12-28: qty 4

## 2013-12-28 MED ORDER — OXYTOCIN 10 UNIT/ML IJ SOLN
40.0000 [IU] | INTRAVENOUS | Status: DC | PRN
  Administered 2013-12-28: 40 [IU] via INTRAVENOUS

## 2013-12-28 MED ORDER — SCOPOLAMINE 1 MG/3DAYS TD PT72
MEDICATED_PATCH | TRANSDERMAL | Status: AC
  Administered 2013-12-28: 1.5 mg via TRANSDERMAL
  Filled 2013-12-28: qty 1

## 2013-12-28 MED ORDER — BISACODYL 10 MG RE SUPP: 10.0000 mg | Freq: Every day | RECTAL | Status: DC | PRN

## 2013-12-28 MED ORDER — PHENYLEPHRINE 8 MG IN D5W 100 ML (0.08MG/ML) PREMIX OPTIME
INJECTION | INTRAVENOUS | Status: AC
  Filled 2013-12-28: qty 100

## 2013-12-28 SURGICAL SUPPLY — 40 items
BENZOIN TINCTURE PRP APPL 2/3 (GAUZE/BANDAGES/DRESSINGS) ×3 IMPLANT
CLAMP CORD UMBIL (MISCELLANEOUS) IMPLANT
CLOSURE WOUND 1/2 X4 (GAUZE/BANDAGES/DRESSINGS) ×1
CLOTH BEACON ORANGE TIMEOUT ST (SAFETY) ×3 IMPLANT
CONTAINER PREFILL 10% NBF 15ML (MISCELLANEOUS) IMPLANT
DERMABOND ADHESIVE PROPEN (GAUZE/BANDAGES/DRESSINGS) ×2
DERMABOND ADVANCED .7 DNX6 (GAUZE/BANDAGES/DRESSINGS) ×1 IMPLANT
DRAIN JACKSON PRT FLT 10 (DRAIN) IMPLANT
DRAPE LG THREE QUARTER DISP (DRAPES) IMPLANT
DRSG OPSITE POSTOP 4X10 (GAUZE/BANDAGES/DRESSINGS) ×3 IMPLANT
DURAPREP 26ML APPLICATOR (WOUND CARE) ×3 IMPLANT
ELECT REM PT RETURN 9FT ADLT (ELECTROSURGICAL) ×3
ELECTRODE REM PT RTRN 9FT ADLT (ELECTROSURGICAL) ×1 IMPLANT
EVACUATOR SILICONE 100CC (DRAIN) IMPLANT
EXTRACTOR VACUUM M CUP 4 TUBE (SUCTIONS) IMPLANT
EXTRACTOR VACUUM M CUP 4' TUBE (SUCTIONS)
GLOVE BIO SURGEON STRL SZ 6.5 (GLOVE) ×2 IMPLANT
GLOVE BIO SURGEONS STRL SZ 6.5 (GLOVE) ×1
GLOVE BIOGEL PI IND STRL 7.0 (GLOVE) ×1 IMPLANT
GLOVE BIOGEL PI INDICATOR 7.0 (GLOVE) ×2
GOWN STRL REUS W/TWL LRG LVL3 (GOWN DISPOSABLE) ×6 IMPLANT
HEMOSTAT SURGICEL 4X8 (HEMOSTASIS) ×3 IMPLANT
KIT ABG SYR 3ML LUER SLIP (SYRINGE) IMPLANT
NEEDLE HYPO 25X5/8 SAFETYGLIDE (NEEDLE) IMPLANT
NS IRRIG 1000ML POUR BTL (IV SOLUTION) ×3 IMPLANT
PACK C SECTION WH (CUSTOM PROCEDURE TRAY) ×3 IMPLANT
PAD OB MATERNITY 4.3X12.25 (PERSONAL CARE ITEMS) ×3 IMPLANT
RTRCTR C-SECT PINK 25CM LRG (MISCELLANEOUS) IMPLANT
STRIP CLOSURE SKIN 1/2X4 (GAUZE/BANDAGES/DRESSINGS) ×2 IMPLANT
SUT CHROMIC 0 CT 1 (SUTURE) ×3 IMPLANT
SUT MNCRL AB 3-0 PS2 27 (SUTURE) ×3 IMPLANT
SUT PLAIN 2 0 (SUTURE) ×4
SUT PLAIN 2 0 XLH (SUTURE) ×3 IMPLANT
SUT PLAIN ABS 2-0 CT1 27XMFL (SUTURE) ×2 IMPLANT
SUT SILK 2 0 SH (SUTURE) IMPLANT
SUT VIC AB 0 CTX 36 (SUTURE) ×8
SUT VIC AB 0 CTX36XBRD ANBCTRL (SUTURE) ×4 IMPLANT
TOWEL OR 17X24 6PK STRL BLUE (TOWEL DISPOSABLE) ×3 IMPLANT
TRAY FOLEY CATH 14FR (SET/KITS/TRAYS/PACK) ×3 IMPLANT
WATER STERILE IRR 1000ML POUR (IV SOLUTION) ×3 IMPLANT

## 2013-12-28 NOTE — Consult Note (Signed)
[facility]  Delivery Note:  C-section       12/28/2013  12:41 PM  I was called to the operating room at the request of the patient's obstetrician (Dr. Normand Sloopillard) for a primary c-section for breech presentation.  PRENATAL HX:  23 y/o G3P0020 mother at 239 and 1/[redacted] weeks gestation.  Pregnancy complicated by breech presentation, mildly dolicocephalic on prenatal U/S, and AC less than 3rd%tile   INTRAPARTUM HX:   Primary c-section with AROM at delivery  DELIVERY:  Infant was vigorous at delivery, requiring no resuscitation other than standard warming, drying and stimulation.  APGARs 8 and 9.  Exam notable for bilateral labial swelling without any masses.  This could be swelling due to in utero position but exam should be monitored for potential hernias.  Exam is otherwise within normal limits.  After 5 minutes, baby left with nurse to assist parents with skin-to-skin care.   _____________________ Electronically Signed By: Maryan CharLindsey Murphy, MD Neonatologist

## 2013-12-28 NOTE — Anesthesia Preprocedure Evaluation (Signed)
Anesthesia Evaluation  Patient identified by MRN, date of birth, ID band Patient awake    Reviewed: Allergy & Precautions, H&P , NPO status , Patient's Chart, lab work & pertinent test results  Airway Mallampati: II      Dental   Pulmonary former smoker,  breath sounds clear to auscultation        Cardiovascular Exercise Tolerance: Good Rhythm:regular Rate:Normal     Neuro/Psych  Headaches,    GI/Hepatic   Endo/Other    Renal/GU      Musculoskeletal   Abdominal   Peds  Hematology   Anesthesia Other Findings   Reproductive/Obstetrics (+) Pregnancy                           Anesthesia Physical Anesthesia Plan  ASA: II  Anesthesia Plan: Spinal   Post-op Pain Management:    Induction:   Airway Management Planned:   Additional Equipment:   Intra-op Plan:   Post-operative Plan:   Informed Consent: I have reviewed the patients History and Physical, chart, labs and discussed the procedure including the risks, benefits and alternatives for the proposed anesthesia with the patient or authorized representative who has indicated his/her understanding and acceptance.     Plan Discussed with: Anesthesiologist, CRNA and Surgeon  Anesthesia Plan Comments:         Anesthesia Quick Evaluation
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
## 2013-12-28 NOTE — Anesthesia Postprocedure Evaluation (Signed)
  Anesthesia Post Note  Patient: Candace Pearson  Procedure(s) Performed: Procedure(s) (LRB): CESAREAN SECTION (N/A)  Anesthesia type: Spinal  Patient location: PACU  Post pain: Pain level controlled  Post assessment: Post-op Vital signs reviewed  Last Vitals:  Filed Vitals:   12/28/13 1345  BP: 96/63  Pulse: 71  Temp:   Resp: 20    Post vital signs: Reviewed  Level of consciousness: awake  Complications: No apparent anesthesia complications

## 2013-12-28 NOTE — Transfer of Care (Signed)
Immediate Anesthesia Transfer of Care Note  Patient: Candace BalloonBriony Nienaber  Procedure(s) Performed: Procedure(s): CESAREAN SECTION (N/A)  Patient Location: PACU  Anesthesia Type:Spinal  Level of Consciousness: awake, alert , oriented and patient cooperative  Airway & Oxygen Therapy: Patient Spontanous Breathing  Post-op Assessment: Report given to PACU RN and Post -op Vital signs reviewed and stable  Post vital signs: Reviewed and stable  Complications: No apparent anesthesia complications

## 2013-12-28 NOTE — Interval H&P Note (Signed)
History and Physical Interval Note:  12/28/2013 11:51 AM  [name]  has presented today for surgery, with the diagnosis of Breech Presentation  The various methods of treatment have been discussed with the patient and family. After consideration of risks, benefits and other options for treatment, the patient has consented to  Procedure(s): CESAREAN SECTION (N/A) as a surgical intervention .  The patient's history has been reviewed, patient examined, no change in status, stable for surgery.  I have reviewed the patient's chart and labs.  Questions were answered to the patient's satisfaction.     [facility] HospitalDILLARD,NAIMA A

## 2013-12-28 NOTE — Op Note (Signed)
Cesarean Section Procedure Note   Candace Pearson  12/28/2013  Indications: Breech Presentation   Pre-operative Diagnosis: Breech Presentation.   Post-operative Diagnosis: Same   Surgeon: Surgeon(s) and Role:    * Michael LitterNaima A Gershon Shorten, MD - Primary   Assistants: Sherre ScarletKimberly Williams   Anesthesia: spinal   Procedure Details:  The patient was seen in the Holding Room. The risks, benefits, complications, treatment options, and expected outcomes were discussed with the patient. The patient concurred with the proposed plan, giving informed consent. identified as Candace Pearson and the procedure verified as C-Section Delivery. A Time Out was held and the above information confirmed.  After induction of anesthesia, the patient was draped and prepped in the usual sterile manner. A transverse incision was made and carried down through the subcutaneous tissue to the fascia. Fascial incision was made in the midline and extended transversely. The fascia was separated from the underlying rectus muscle superiorly and inferiorly. The peritoneum was identified and entered. Peritoneal incision was extended longitudinally with good visualization of bowel and bladder. The utero-vesical peritoneal reflection was incised transversely and the bladder flap was bluntly freed from the lower uterine segment.  An alexsis retractor was placed in the abdomen.   A low transverse uterine incision was made. Delivered from breech frank presentation was a  infant, with Apgar scores of 8 at one minute and 9 at five minutes. Cord ph was not sent the umbilical cord was clamped and cut cord blood was obtained for evaluation. The placenta was removed Intact and appeared normal. The uterine outline, tubes and ovaries appeared normal}. The uterine incision was closed with running locked sutures of 0Vicryl. A second layer 0 vicrlyl was used to imbricate the uterine incision    Hemostasis was observed. Lavage was carried out until clear. The alexsis  was removed.  The peritoneum was closed with 0 chromic.  The muscles were examined and any bleeders were made hemostatic using bovie cautery device.   The fascia was then reapproximated with running sutures of 0 vicryl.  The subcutaneous tissue was reapproximated  With interrupted stitches using 2-0 plain gut. The subcuticular closure was performed using 3-570monocryl     Instrument, sponge, and needle counts were correct prior the abdominal closure and were correct at the conclusion of the case.    Findings: infant was delivered from frank breech presentation. The fluid was clear.  The uterus tubes and ovaries appeared normal.     Estimated Blood Loss: 500cc   Total IV Fluids: 2000ml   Urine Output: 400CC OF clear urine  Specimens: placenta to pathology  Complications: no complications  Disposition: PACU - hemodynamically stable.   Maternal Condition: stable   Baby condition / location:  Couplet care / Skin to Skin  Attending Attestation: I performed the procedure.   Signed: Surgeon(s): Michael LitterNaima A Jamerion Cabello, MD

## 2013-12-28 NOTE — Anesthesia Procedure Notes (Signed)
Spinal  Patient location during procedure: OR Start time: 12/28/2013 12:03 PM Staffing Anesthesiologist: Brayton CavesJACKSON, Chivonne Rascon Performed by: anesthesiologist  Preanesthetic Checklist Completed: patient identified, site marked, surgical consent, pre-op evaluation, timeout performed, IV checked, risks and benefits discussed and monitors and equipment checked Spinal Block Patient position: sitting Prep: DuraPrep Patient monitoring: heart rate, cardiac monitor, continuous pulse ox and blood pressure Approach: midline Location: L3-4 Injection technique: single-shot Needle Needle type: Sprotte  Needle gauge: 24 G Needle length: 9 cm Assessment Sensory level: T4 Additional Notes Patient identified.  Risk benefits discussed including failed block, incomplete pain control, headache, nerve damage, paralysis, blood pressure changes, nausea, vomiting, reactions to medication both toxic or allergic, and postpartum back pain.  Patient expressed understanding and wished to proceed.  All questions were answered.  Sterile technique used throughout procedure.  CSF was clear.  No parasthesia or other complications.  Please see nursing notes for vital signs.

## 2013-12-29 ENCOUNTER — Encounter (HOSPITAL_COMMUNITY): Payer: Self-pay | Admitting: Obstetrics and Gynecology

## 2013-12-29 LAB — CBC
HEMATOCRIT: 29.1 % — AB (ref 36.0–46.0)
HEMOGLOBIN: 9.9 g/dL — AB (ref 12.0–15.0)
MCH: 30.7 pg (ref 26.0–34.0)
MCHC: 34 g/dL (ref 30.0–36.0)
MCV: 90.4 fL (ref 78.0–100.0)
Platelets: 176 10*3/uL (ref 150–400)
RBC: 3.22 MIL/uL — ABNORMAL LOW (ref 3.87–5.11)
RDW: 12.8 % (ref 11.5–15.5)
WBC: 9.5 10*3/uL (ref 4.0–10.5)

## 2013-12-29 NOTE — Lactation Note (Signed)
This note was copied from the chart of Candace Sharion BalloonBriony Neer. Lactation Consultation Note  Patient Name: Candace Pearson ZOXWR'UToday's Date: 12/29/2013 Reason for consult: Initial assessment  Pt requested to be seen by LC.  Infant sleeping upon entering room and had just fed 1 hr prior for 30 min.  Infant is 5726 hrs old and has breastfed x8 (10-40 min) in past 24 hrs; voids-3; stools-1.  Mom is a P1 with history of MJ use during pregnancy. Mom asked LC to assist, so undressed infant and infant began showing cues.  Taught mom how to use asymetrical latching technique and sandwiching of breast.  Mom stated she was not sure she had enough milk on left side because she was not able to hand express any out.  Reviewed hand expression on left side with return demonstration and obsevation of colostrum easily dripping from left nipple.  Mom latched infant independently to left breast in football hold with depth and stated the latch felt good, no pinching noted.  Infant latched with depth and few swallows heard.  LS-7.  Mom c/o slight tenderness; comfort gels given and explained use.  Lots basic teaching done.  Educated on feeding cues, size of infant's stomach and cluster feeding.  Encouraged continued exclusive breastmilk feeding and benefits of breastmilk.  "Risk of MJ Use During Breastfeeding" sheet given in a general way but was not able to go into detail d/t visitor in room.   Lactation brochure given and informed of outpatient services and support group.  Encouraged to call for assistance if needed with latching.     Maternal Data Formula Feeding for Exclusion: Yes Reason for exclusion: Mother's choice to formula and breast feed on admission Infant to breast within first hour of birth: Yes Has patient been taught Hand Expression?: Yes Does the patient have breastfeeding experience prior to this delivery?: Yes  Feeding Feeding Type: Breast Fed Length of feed: 10 min  LATCH Score/Interventions Latch: Grasps  breast easily, tongue down, lips flanged, rhythmical sucking. Intervention(s): Adjust position;Breast compression;Breast massage  Audible Swallowing: A few with stimulation Intervention(s): Hand expression  Type of Nipple: Everted at rest and after stimulation  Comfort (Breast/Nipple): Filling, red/small blisters or bruises, mild/mod discomfort  Problem noted: Mild/Moderate discomfort Interventions (Mild/moderate discomfort): Hand expression;Hand massage;Comfort gels  Hold (Positioning): Assistance needed to correctly position infant at breast and maintain latch. Intervention(s): Breastfeeding basics reviewed;Support Pillows;Position options;Skin to skin  LATCH Score: 7  Lactation Tools Discussed/Used Tools: Comfort gels WIC Program: Yes Pump Review: Setup, frequency, and cleaning   Consult Status Consult Status: Follow-up Date: 12/30/13 Follow-up type: In-patient    Lendon KaVann, Stephanie Walker 12/29/2013, 2:38 PM

## 2013-12-29 NOTE — Progress Notes (Signed)
Subjective: Postpartum Day 1: Cesarean Delivery due to Breech Patient up ad lib, reports no syncope or dizziness. Feeding:  breast Contraceptive plan:  Paragard  Objective: Vital signs in last 24 hours: Temp:  [97.2 F (36.2 C)-98.5 F (36.9 C)] 98.2 F (36.8 C) (06/17 0900) Pulse Rate:  [60-78] 64 (06/17 0900) Resp:  [16-19] 18 (06/17 0900) BP: (85-109)/(50-72) 85/52 mmHg (06/17 0900) SpO2:  [96 %-100 %] 99 % (06/17 0900)  Physical Exam:  General: alert and cooperative Lochia: appropriate Uterine Fundus: firm Abdomen: non distended Incision: Honeycomb dressing CDI, small amt of old drainage on the right DVT Evaluation: No evidence of DVT seen on physical exam. JP drain:   n/a   Recent Labs  12/27/13 1145 12/29/13 0555  HGB 12.4 9.9*  HCT 36.0 29.1*  WBC 9.0 9.5    Assessment/Plan: Status post Cesarean section day 1. Doing well postoperatively.   Continue current care. Fe supplement bid    Standard, Venus CNM, MSN 12/29/2013, 3:40 PM

## 2013-12-29 NOTE — Anesthesia Postprocedure Evaluation (Signed)
  Anesthesia Post-op Note  Patient: Candace BalloonBriony Dimichele  Procedure(s) Performed: Procedure(s): CESAREAN SECTION (N/A)  Patient Location: Mother/Baby  Anesthesia Type:Spinal  Level of Consciousness: awake, alert , oriented and patient cooperative  Airway and Oxygen Therapy: Patient Spontanous Breathing  Post-op Pain: mild  Post-op Assessment: Patient's Cardiovascular Status Stable, Respiratory Function Stable, No headache, No backache, No residual numbness and No residual motor weakness  Post-op Vital Signs: stable  Last Vitals:  Filed Vitals:   12/29/13 0500  BP: 87/50  Pulse: 64  Temp: 36.6 C  Resp:     Complications: No apparent anesthesia complications

## 2013-12-29 NOTE — Clinical Social Work Maternal (Signed)
Clinical Social Work Department  PSYCHOSOCIAL ASSESSMENT - MATERNAL/CHILD  12/29/2013  Patient: Candace Pearson,Candace Pearson Account Number: [id number] Admit Date: 12/28/2013  Childs Name:  Candace Pearson   Clinical Social Worker: TEDRA SLADE, LCSW Date/Time: 12/29/2013 01:47 PM  Date Referred: 12/29/2013  Referral source   CN    Referred reason   Substance Abuse   Other referral source:  I: FAMILY / HOME ENVIRONMENT  Child's legal guardian: PARENT  Guardian - Name  Guardian - Age  Guardian - Address   Candace Pearson  22  [address]; [place], [place] [address]   Candace Pearson  27    Other household support members/support persons  Other support:  II PSYCHOSOCIAL DATA  Information Source: Patient Interview  Financial and Community Resources  Employment:  Financial resources: Private Insurance  If Medicaid - County: [place]  Other   WIC   School / Grade:  Maternity Care Coordinator / Child Services Coordination / Early Interventions: Cultural issues impacting care:  III STRENGTHS  Strengths   Adequate Resources   Home prepared for Child (including basic supplies)   Supportive family/friends   Strength comment:  IV RISK FACTORS AND CURRENT PROBLEMS  Current Problem: YES  Risk Factor & Current Problem  Patient Issue  Family Issue  Risk Factor / Current Problem Comment   Substance Abuse  Y  N  Hx MJ use   V SOCIAL WORK ASSESSMENT  CSW met with pt to assess history of MJ use. Pt admits to smoking " on the weekends" prior to pregnancy. Pt continued to smoke until she "gradually" stopped sometime in September. She denies other illegal substance use & verbalized an understanding of hospital drug testing policy. UDS is negative, meconium results are pending. Pt has majority of infant supplies but expressed a need for a car seat. CSW informed pt of the car seat purchase fee of $30 available through volunteer services department. FOB was at the bedside & appears to be bonding well. Pt appears to be  appropriate & bonding well at this time. CSW will continue to monitor drug screen results & make a referral if needed.   VI SOCIAL WORK PLAN  Social Work Plan   No Further Intervention Required / No Barriers to Discharge   Type of pt/family education:  If child protective services report - county:  If child protective services report - date:  Information/referral to community resources comment:  Other social work plan:       Clinical Social Work Department PSYCHOSOCIAL ASSESSMENT - MATERNAL/CHILD 12/29/2013  Patient:  Candace Pearson, Candace Pearson  Account Number:  [id number]  Admit Date:  12/28/2013  [name] Name:   Candace Pearson    Clinical Social Worker:  Gerri Spore, LCSW   Date/Time:  12/29/2013 01:47 PM  Date Referred:  12/29/2013   Referral source  CN     Referred reason  Substance Abuse   Other referral source:    I:  FAMILY / [place] legal guardian:  PARENT  Guardian - Name Guardian - Age Guardian - Address  Candace Pearson 22 [address]; [place], [place] [address]  Candace Pearson 27    Other household support members/support persons Other support:    II  PSYCHOSOCIAL DATA Information Source:  Patient Interview  Occupational hygienist Employment:   Museum/gallery curator resources:  Multimedia programmer If [place]:  [place] Other  [place] / Grade:   Maternity Care Coordinator / Child Services Coordination / Early Interventions:  Cultural issues impacting care:    III  STRENGTHS Strengths  Adequate Resources  Home prepared for Child (including basic supplies)  Supportive family/friends   Strength comment:    IV  RISK FACTORS AND CURRENT PROBLEMS Current Problem:  YES   Risk Factor & Current Problem Patient Issue Family Issue Risk Factor / Current Problem Comment  Substance Abuse Y N Hx MJ use    V  SOCIAL WORK ASSESSMENT CSW met with pt to assess history of MJ use.  Pt admits to smoking " on the weekends" prior to pregnancy.  Pt continued to smoke until she "gradually" stopped sometime in September.  She denies other illegal substance use & verbalized an understanding of hospital drug testing policy.  UDS is negative, meconium results are pending.  Pt has majority of infant supplies but expressed a need for a car seat.  CSW informed pt of the car seat purchase fee of $30 available through volunteer services department.  FOB was at the bedside & appears to  be bonding well.  Pt appears to be appropriate & bonding well at this time.  CSW will continue to monitor drug screen results & make a referral if needed.      VI SOCIAL WORK PLAN Social Work Plan  No Further Intervention Required / No Barriers to Discharge   Type of pt/family education:   If child protective services report - county:   If child protective services report - date:   Information/referral to community resources comment:   Other social work plan:

## 2013-12-29 NOTE — Addendum Note (Signed)
Addendum created 12/29/13 0901 by Earmon PhoenixValerie P Wilkerson, CRNA   Modules edited: Charges VN, Notes Section   Notes Section:  File: [id number]

## 2013-12-30 NOTE — Progress Notes (Signed)
Subjective: Postpartum Day 2: Cesarean Delivery due to Breech Patient up ad lib, reports no syncope or dizziness. Feeding:  Breast LC at the bedside C/O nipple pain.  LC recommend nipple cream Contraceptive plan:  IUD  Objective: Vital signs in last 24 hours: Temp:  [98.2 F (36.8 C)-98.4 F (36.9 C)] 98.2 F (36.8 C) (06/18 0557) Pulse Rate:  [72-74] 72 (06/18 0557) Resp:  [18] 18 (06/18 0557) BP: (94-97)/(56-62) 94/62 mmHg (06/18 0557)  Physical Exam:  General: alert and cooperative Lochia: appropriate Uterine Fundus: firm Abdomen:  Non distended, non tender Incision: healing well DVT Evaluation: No evidence of DVT seen on physical exam.    Recent Labs  12/29/13 0555  HGB 9.9*  HCT 29.1*  WBC 9.5    Assessment/Plan: Status post Cesarean section day 2 Doing well postoperatively.  Continue current care. Plan for discharge tomorrow and Breastfeeding Rx Dr Allene PyoNewman's [facility]ipple [facility]   Standard, Venus CNM, MSN 12/30/2013, 2:24 PM

## 2013-12-30 NOTE — Lactation Note (Addendum)
This note was copied from the chart of Candace Pearson Grega. Lactation Consultation Note  Patient Name: Candace Pearson Pascoe ZOXWR'UToday's Date: 12/30/2013 Reason for consult: Follow-up assessment;Breast/nipple pain Mom c/o of increased nipple pain, burning when the baby comes off the breast. Nipples appear intact, not shiny or itchy. Baby has been cluster feeding. Mom is wearing comfort gels with relief. LC assisted Mom with positioning and obtaining good depth with latch. Mom reports initial pain that improves as the baby was at the breast. Mom states "this feels much better". Reviewed how to assess for good depth. Care for sore nipples reviewed. Nigel BridgemanVicki Latham, CNM [facility] after d/c, anticipating d/c tomorrow. Some basic teaching reviewed with Mom. Encouraged to call for RN or LC to observe another latch. Advised to call for assist if nipple pain does not continue to improve. Mom reports hand pump flange is uncomfortble. Changed flange to size 27.   Maternal Data    Feeding Feeding Type: Breast Fed Length of feed: 15 min  LATCH Score/Interventions Latch: Grasps breast easily, tongue down, lips flanged, rhythmical sucking. Intervention(s): Adjust position;Assist with latch;Breast massage;Breast compression  Audible Swallowing: A few with stimulation  Type of Nipple: Everted at rest and after stimulation  Comfort (Breast/Nipple): Filling, red/small blisters or bruises, mild/mod discomfort  Problem noted: Mild/Moderate discomfort Interventions (Mild/moderate discomfort): Comfort gels;Hand massage;Hand expression (EBM to sore nipples)  Hold (Positioning): Assistance needed to correctly position infant at breast and maintain latch. Intervention(s): Breastfeeding basics reviewed;Support Pillows;Position options;Skin to skin  LATCH Score: 7  Lactation Tools Discussed/Used Tools: Pump;Flanges;Comfort gels Flange Size: 27 Breast pump type: Manual   Consult Status Consult  Status: Follow-up Date: 12/31/13 Follow-up type: In-patient    Alfred LevinsGranger, Ollie Esty Ann 12/30/2013, 2:46 PM

## 2013-12-31 MED ORDER — OXYCODONE-ACETAMINOPHEN 5-325 MG PO TABS: 1.0000 | ORAL_TABLET | ORAL | Status: DC | PRN

## 2013-12-31 MED ORDER — FERROUS SULFATE 325 (65 FE) MG PO TABS: 325.0000 mg | ORAL_TABLET | Freq: Two times a day (BID) | ORAL | Status: DC

## 2013-12-31 MED ORDER — IBUPROFEN 600 MG PO TABS: 600.0000 mg | ORAL_TABLET | Freq: Four times a day (QID) | ORAL | Status: DC

## 2013-12-31 MED ORDER — LANOLIN HYDROUS EX OINT: 1.0000 "application " | TOPICAL_OINTMENT | CUTANEOUS | Status: DC | PRN

## 2013-12-31 NOTE — Discharge Instructions (Signed)
Postpartum Depression and Baby Blues °The postpartum period begins right after the birth of a baby. During this time, there is often a great amount of joy and excitement. It is also a time of many changes in the life of the parents. Regardless of how many times a mother gives birth, each child brings new challenges and dynamics to the family. It is not unusual to have feelings of excitement along with confusing shifts in moods, emotions, and thoughts. All mothers are at risk of developing postpartum depression or the "baby blues." These mood changes can occur right after giving birth, or they may occur many months after giving birth. The baby blues or postpartum depression can be mild or severe. Additionally, postpartum depression can go away rather quickly, or it can be a long-term condition.  °CAUSES °Raised hormone levels and the rapid drop in those levels are thought to be a main cause of postpartum depression and the baby blues. A number of hormones change during and after pregnancy. Estrogen and progesterone usually decrease right after the delivery of your baby. The levels of thyroid hormone and various cortisol steroids also rapidly drop. Other factors that play a role in these mood changes include major life events and genetics.  °RISK FACTORS °If you have any of the following risks for the baby blues or postpartum depression, know what symptoms to watch out for during the postpartum period. Risk factors that may increase the likelihood of getting the baby blues or postpartum depression include: °· Having a personal or family history of depression.   °· Having depression while being pregnant.   °· Having premenstrual mood issues or mood issues related to oral contraceptives. °· Having a lot of life stress.   °· Having marital conflict.   °· Lacking a social support network.   °· Having a baby with special needs.   °· Having health problems, such as diabetes.   °SIGNS AND SYMPTOMS °Symptoms of baby blues  include: °· Brief changes in mood, such as going from extreme happiness to sadness. °· Decreased concentration.   °· Difficulty sleeping.   °· Crying spells, tearfulness.   °· Irritability.   °· Anxiety.   °Symptoms of postpartum depression typically begin within the first month after giving birth. These symptoms include: °· Difficulty sleeping or excessive sleepiness.   °· Marked weight loss.   °· Agitation.   °· Feelings of worthlessness.   °· Lack of interest in activity or food.   °Postpartum psychosis is a very serious condition and can be dangerous. Fortunately, it is rare. Displaying any of the following symptoms is cause for immediate medical attention. Symptoms of postpartum psychosis include:  °· Hallucinations and delusions.   °· Bizarre or disorganized behavior.   °· Confusion or disorientation.   °DIAGNOSIS  °A diagnosis is made by an evaluation of your symptoms. There are no medical or lab tests that lead to a diagnosis, but there are various questionnaires that a health care provider may use to identify those with the baby blues, postpartum depression, or psychosis. Often, a screening tool called the Edinburgh Postnatal Depression Scale is used to diagnose depression in the postpartum period.  °TREATMENT °The baby blues usually goes away on its own in 1-2 weeks. Social support is often all that is needed. You will be encouraged to get adequate sleep and rest. Occasionally, you may be given medicines to help you sleep.  °Postpartum depression requires treatment because it can last several months or longer if it is not treated. Treatment may include individual or group therapy, medicine, or both to address any social, physiological, and psychological   factors that may play a role in the depression. Regular exercise, a healthy diet, rest, and social support may also be strongly recommended.  Postpartum psychosis is more serious and needs treatment right away. Hospitalization is often needed. HOME CARE  INSTRUCTIONS  Get as much rest as you can. Nap when the baby sleeps.   Exercise regularly. Some women find yoga and walking to be beneficial.   Eat a balanced and nourishing diet.   Do little things that you enjoy. Have a cup of tea, take a bubble bath, read your favorite magazine, or listen to your favorite music.  Avoid alcohol.   Ask for help with household chores, cooking, grocery shopping, or running errands as needed. Do not try to do everything.   Talk to people close to you about how you are feeling. Get support from your partner, family members, friends, or other new moms.  Try to stay positive in how you think. Think about the things you are grateful for.   Do not spend a lot of time alone.   Only take over-the-counter or prescription medicine as directed by your health care provider.  Keep all your postpartum appointments.   Let your health care provider know if you have any concerns.  SEEK MEDICAL CARE IF: You are having a reaction to or problems with your medicine. SEEK IMMEDIATE MEDICAL CARE IF:  You have suicidal feelings.   You think you may harm the baby or someone else. MAKE SURE YOU:  Understand these instructions.  Will watch your condition.  Will get help right away if you are not doing well or get worse. Document Released: 04/04/2004 Document Revised: 07/06/2013 Document Reviewed: 04/12/2013 [facility] Patient Information 2015 [place], [place]. This information is not intended to replace advice given to you by your health care provider. Make sure you discuss any questions you have with your health care provider. Intrauterine Device Information An intrauterine device (IUD) is inserted into your uterus to prevent pregnancy. There are two types of IUDs available:   Copper IUD--This type of IUD is wrapped in copper wire and is placed inside the uterus. Copper makes the uterus and fallopian tubes produce a fluid that kills sperm. The copper IUD can  stay in place for 10 years.  Hormone IUD--This type of IUD contains the hormone progestin (synthetic progesterone). The hormone thickens the cervical mucus and prevents sperm from entering the uterus. It also thins the uterine lining to prevent implantation of a fertilized egg. The hormone can weaken or kill the sperm that get into the uterus. One type of hormone IUD can stay in place for 5 years, and another type can stay in place for 3 years. Your health care provider will make sure you are a good candidate for a contraceptive IUD. Discuss with your health care provider the possible side effects.  ADVANTAGES OF AN INTRAUTERINE DEVICE  IUDs are highly effective, reversible, long acting, and low maintenance.   There are no estrogen-related side effects.   An IUD can be used when breastfeeding.   IUDs are not associated with weight gain.   The copper IUD works immediately after insertion.   The hormone IUD works right away if inserted within 7 days of your period starting. You will need to use a backup method of birth control for 7 days if the hormone IUD is inserted at any other time in your cycle.  The copper IUD does not interfere with your female hormones.   The hormone IUD can make  heavy menstrual periods lighter and decrease cramping.   The hormone IUD can be used for 3 or 5 years.   The copper IUD can be used for 10 years. DISADVANTAGES OF AN INTRAUTERINE DEVICE  The hormone IUD can be associated with irregular bleeding patterns.   The copper IUD can make your menstrual flow heavier and more painful.   You may experience cramping and vaginal bleeding after insertion.  Document Released: 06/04/2004 Document Revised: 03/03/2013 Document Reviewed: 12/20/2012 [facility] Patient Information 2015 [place]Exit[place], [place]. This information is not intended to replace advice given to you by your health care provider. Make sure you discuss any questions you have with your health care  provider.

## 2013-12-31 NOTE — Lactation Note (Signed)
This note was copied from the chart of Candace Sharion BalloonBriony Fant. Lactation Consultation Note  Patient Name: Candace Pearson ZOXWR'UToday's Date: 12/31/2013 Reason for consult: Follow-up assessment Baby 69 hours of life. LC visit just prior to discharge. Mom reports that nipples are not as sore as earlier. Mom given new set of comfort gels. Nipples are intact, no visible irritation noted. Enc mom to make sure baby maintains deep latch. Mom states baby latches deeply, and she hears gulping swallows when baby is suckling. Discussed engorgement prevention/treatment. Mom aware of OP/BFSG and community resources.   Maternal Data    Feeding Feeding Type:  ([name], [facility] reports baby latching well, mom hears gulping/swallows.)  LATCH Score/Interventions                      Lactation Tools Discussed/Used     Consult Status Consult Status: Complete    Nancy NordmannWILLIARD, Jaquilla Woodroof 12/31/2013, 10:15 AM

## 2013-12-31 NOTE — Discharge Summary (Signed)
Cesarean Section Delivery Discharge Summary  [name]  DOB:    Nov 16, 1990 MRN:    [id number] CSN:    [id number]  Date of admission:                  12/28/2013  Date of discharge:                   12/31/2013  Procedures this admission: Scheduled Primary Cesarean Section for Fetal MalPresentation-Breech  Date of Delivery: 12/28/2013  Newborn Data:  Live born female  Birth Weight: 7 lb 3.7 oz (3280 g) APGAR: 8, 9  Home with mother. Name: Unknown Circumcision Plan: N/A  History of Present Illness:  Ms. Candace Pearson is a 23 y.o. female, G4P0020, who presents at 39.[redacted] weeks gestation. The patient has been followed at the [facility].    Her pregnancy has been complicated by: -Uterine size for dates discrepancy --Breech presentation  -H/O Migraine headaches  -Known or suspected fetal abnormality  --Infant head shape mildly Dolicocephalic   Patient Active Problem List   Diagnosis Date Noted  . S/P cesarean section 12/28/2013   Hospital course:  The patient was admitted for scheduled primary cesarean section.   Her postpartum course was not complicated.  She was discharged to home on postpartum day 3 doing well.  Feeding:  breast  Contraception:  IUD  Discharge hemoglobin:  Hemoglobin  Date Value Ref Range Status  12/29/2013 9.9* 12.0 - 15.0 g/dL Final     DELTA CHECK NOTED     REPEATED TO VERIFY     HCT  Date Value Ref Range Status  12/29/2013 29.1* 36.0 - 46.0 % Final    Discharge Physical Exam:   General: alert, cooperative and no distress Lochia: appropriate Uterine Fundus: firm, U/-2 Abdomen: BSX4, soft, appropriately tender Incision: healing well, no significant drainage, no dehiscence, no significant erythema, Honeycomb removed, steri-strips replaced DVT Evaluation: No evidence of DVT seen on physical exam. Negative Homan's sign. No significant calf/ankle  edema.  Intrapartum Procedures: cesarean: low cervical, transverse Postpartum Procedures: none Complications-Operative and Postpartum: none  Discharge Diagnoses: Term Pregnancy-delivered  Discharge Information:  Activity:           pelvic rest Diet:                routine Medications: Ibuprofen and Percocet Condition:      stable Instructions: Remove steri-strips in 7-10 days.  Incision care guidelines: how to clean, when to call, and anticipated healing.   Care After Cesarean Delivery  Refer to this sheet in the next few weeks. These instructions provide you with information on caring for yourself after your procedure. Your caregiver may also give you specific instructions. Your treatment has been planned according to current medical practices, but problems sometimes occur. Call your caregiver if you have any problems or questions after you go home. HOME CARE INSTRUCTIONS  Only take over-the-counter or prescription medicines as directed by your caregiver.  Do not drink alcohol, especially if you are breastfeeding or taking medicine to relieve pain.  Do not chew or smoke tobacco.  Continue to use good perineal care. Good perineal care includes:  Wiping your perineum from front to back.  Keeping your perineum clean.  Check your cut (incision) daily for increased redness, drainage, swelling, or separation of skin.  Clean your incision gently with soap and water every day, and then pat it dry. If your caregiver says it is  okay, leave the incision uncovered. Use a bandage (dressing) if the incision is draining fluid or appears irritated. If the adhesive strips across the incision do not fall off within 7 days, carefully peel them off.  Hug a pillow when coughing or sneezing until your incision is healed. This helps to relieve pain.  Do not use tampons or douche until your caregiver says it is okay.  Shower, wash your hair, and take tub baths as directed by your  caregiver.  Wear a well-fitting bra that provides breast support.  Limit wearing support panties or control-top hose.  Drink enough fluids to keep your urine clear or pale yellow.  Eat high-fiber foods such as whole grain cereals and breads, brown rice, beans, and fresh fruits and vegetables every day. These foods may help prevent or relieve constipation.  Resume activities such as climbing stairs, driving, lifting, exercising, or traveling as directed by your caregiver.  Talk to your caregiver about resuming sexual activities. This is dependent upon your risk of infection, your rate of healing, and your comfort and desire to resume sexual activity.  Try to have someone help you with your household activities and your newborn for at least a few days after you leave the hospital.  Rest as much as possible. Try to rest or take a nap when your newborn is sleeping.  Increase your activities gradually.  Keep all of your scheduled postpartum appointments. It is very important to keep your scheduled follow-up appointments. At these appointments, your caregiver will be checking to make sure that you are healing physically and emotionally. SEEK MEDICAL CARE IF:   You are passing large clots from your vagina. Save any clots to show your caregiver.  You have a foul smelling discharge from your vagina.  You have trouble urinating.  You are urinating frequently.  You have pain when you urinate.  You have a change in your bowel movements.  You have increasing redness, pain, or swelling near your incision.  You have pus draining from your incision.  Your incision is separating.  You have painful, hard, or reddened breasts.  You have a severe headache.  You have blurred vision or see spots.  You feel sad or depressed.  You have thoughts of hurting yourself or your newborn.  You have questions about your care, the care of your newborn, or medicines.  You are dizzy or  lightheaded.  You have a rash.  You have pain, redness, or swelling at the site of the removed intravenous access (IV) tube.  You have nausea or vomiting.  You stopped breastfeeding and have not had a menstrual period within 12 weeks of stopping.  You are not breastfeeding and have not had a menstrual period within 12 weeks of delivery.  You have a fever. SEEK IMMEDIATE MEDICAL CARE IF:  You have persistent pain.  You have chest pain.  You have shortness of breath.  You faint.  You have leg pain.  You have stomach pain.  Your vaginal bleeding saturates 2 or more sanitary pads in 1 hour. MAKE SURE YOU:   Understand these instructions.  Will watch your condition.  Will get help right away if you are not doing well or get worse. Document Released: 03/23/2002 Document Revised: 03/25/2012 Document Reviewed: 02/26/2012 [facility]Exit[facility] Patient Information 2014 [place]akesExitCare, [place]LL[place].   Postpartum Depression and Baby Blues  The postpartum period begins right after the birth of a baby. During this time, there is often a great amount of joy and excitement.  It is also a time of considerable changes in the life of the parent(s). Regardless of how many times a mother gives birth, each child brings new challenges and dynamics to the family. It is not unusual to have feelings of excitement accompanied by confusing shifts in moods, emotions, and thoughts. All mothers are at risk of developing postpartum depression or the "baby blues." These mood changes can occur right after giving birth, or they may occur many months after giving birth. The baby blues or postpartum depression can be mild or severe. Additionally, postpartum depression can resolve rather quickly, or it can be a long-term condition. CAUSES Elevated hormones and their rapid decline are thought to be a main cause of postpartum depression and the baby blues. There are a number of hormones that radically change during and after pregnancy.  Estrogen and progesterone usually decrease immediately after delivering your baby. The level of thyroid hormone and various cortisol steroids also rapidly drop. Other factors that play a major role in these changes include major life events and genetics.  RISK FACTORS If you have any of the following risks for the baby blues or postpartum depression, know what symptoms to watch out for during the postpartum period. Risk factors that may increase the likelihood of getting the baby blues or postpartum depression include:  Havinga personal or family history of depression.  Having depression while being pregnant.  Having premenstrual or oral contraceptive-associated mood issues.  Having exceptional life stress.  Having marital conflict.  Lacking a social support network.  Having a baby with special needs.  Having health problems such as diabetes. SYMPTOMS Baby blues symptoms include:  Brief fluctuations in mood, such as going from extreme happiness to sadness.  Decreased concentration.  Difficulty sleeping.  Crying spells, tearfulness.  Irritability.  Anxiety. Postpartum depression symptoms typically begin within the first month after giving birth. These symptoms include:  Difficulty sleeping or excessive sleepiness.  Marked weight loss.  Agitation.  Feelings of worthlessness.  Lack of interest in activity or food. Postpartum psychosis is a very concerning condition and can be dangerous. Fortunately, it is rare. Displaying any of the following symptoms is cause for immediate medical attention. Postpartum psychosis symptoms include:  Hallucinations and delusions.  Bizarre or disorganized behavior.  Confusion or disorientation. DIAGNOSIS  A diagnosis is made by an evaluation of your symptoms. There are no medical or lab tests that lead to a diagnosis, but there are various questionnaires that a caregiver may use to identify those with the baby blues, postpartum  depression, or psychosis. Often times, a screening tool called the [place] Postnatal Depression Scale is used to diagnose depression in the postpartum period.  TREATMENT The baby blues usually goes away on its own in 1 to 2 weeks. Social support is often all that is needed. You should be encouraged to get adequate sleep and rest. Occasionally, you may be given medicines to help you sleep.  Postpartum depression requires treatment as it can last several months or longer if it is not treated. Treatment may include individual or group therapy, medicine, or both to address any social, physiological, and psychological factors that may play a role in the depression. Regular exercise, a healthy diet, rest, and social support may also be strongly recommended.  Postpartum psychosis is more serious and needs treatment right away. Hospitalization is often needed. HOME CARE INSTRUCTIONS  Get as much rest as you can. Nap when the baby sleeps.  Exercise regularly. Some women find yoga and walking to  be beneficial.  Eat a balanced and nourishing diet.  Do little things that you enjoy. Have a cup of tea, take a bubble bath, read your favorite magazine, or listen to your favorite music.  Avoid alcohol.  Ask for help with household chores, cooking, grocery shopping, or running errands as needed. Do not try to do everything.  Talk to people close to you about how you are feeling. Get support from your partner, family members, friends, or other new moms.  Try to stay positive in how you think. Think about the things you are grateful for.  Do not spend a lot of time alone.  Only take medicine as directed by your caregiver.  Keep all your postpartum appointments.  Let your caregiver know if you have any concerns. SEEK MEDICAL CARE IF: You are having a reaction or problems with your medicine. SEEK IMMEDIATE MEDICAL CARE IF:  You have suicidal feelings.  You feel you may harm the baby or someone  else. Document Released: 04/04/2004 Document Revised: 09/23/2011 Document Reviewed: 05/07/2011 [facility]Exit[facility] Patient Information 2014 [place]ExitCare, [place]LLC.  Discharge to: home  Follow-up Information   [facility] 5 weeks. (Please call if you have any questions or concerns prior to your next visit.)    Specialty:  Obstetrics and Gynecology   Contact information:   [address] [place] [id number] [phone]       Marlene BastMLY, JESSICA LYNN , CNM, MSN  12/31/2013

## 2014-01-05 ENCOUNTER — Encounter (HOSPITAL_COMMUNITY): Payer: Self-pay | Admitting: *Deleted

## 2014-01-17 ENCOUNTER — Encounter (HOSPITAL_COMMUNITY): Payer: Self-pay | Admitting: Emergency Medicine

## 2014-01-17 ENCOUNTER — Emergency Department (HOSPITAL_COMMUNITY)
Admission: EM | Admit: 2014-01-17 | Discharge: 2014-01-17 | Disposition: A | Discharge status: Home / Self Care | Payer: 59 | Attending: Emergency Medicine | Admitting: Emergency Medicine

## 2014-01-17 ENCOUNTER — Emergency Department (HOSPITAL_COMMUNITY): Payer: 59

## 2014-01-17 DIAGNOSIS — Z87891 Personal history of nicotine dependence: Secondary | ICD-10-CM | POA: Diagnosis not present

## 2014-01-17 DIAGNOSIS — N898 Other specified noninflammatory disorders of vagina: Secondary | ICD-10-CM | POA: Insufficient documentation

## 2014-01-17 DIAGNOSIS — M545 Low back pain, unspecified: Secondary | ICD-10-CM | POA: Diagnosis not present

## 2014-01-17 DIAGNOSIS — Z9889 Other specified postprocedural states: Secondary | ICD-10-CM | POA: Insufficient documentation

## 2014-01-17 DIAGNOSIS — R109 Unspecified abdominal pain: Secondary | ICD-10-CM | POA: Insufficient documentation

## 2014-01-17 DIAGNOSIS — N939 Abnormal uterine and vaginal bleeding, unspecified: Secondary | ICD-10-CM

## 2014-01-17 DIAGNOSIS — Z79899 Other long term (current) drug therapy: Secondary | ICD-10-CM | POA: Diagnosis not present

## 2014-01-17 DIAGNOSIS — Z872 Personal history of diseases of the skin and subcutaneous tissue: Secondary | ICD-10-CM | POA: Insufficient documentation

## 2014-01-17 DIAGNOSIS — Z8679 Personal history of other diseases of the circulatory system: Secondary | ICD-10-CM | POA: Insufficient documentation

## 2014-01-17 DIAGNOSIS — Z98891 History of uterine scar from previous surgery: Secondary | ICD-10-CM

## 2014-01-17 LAB — CBC
HCT: 40.7 % (ref 36.0–46.0)
Hemoglobin: 13.4 g/dL (ref 12.0–15.0)
MCH: 30 pg (ref 26.0–34.0)
MCHC: 32.9 g/dL (ref 30.0–36.0)
MCV: 91.1 fL (ref 78.0–100.0)
Platelets: 316 10*3/uL (ref 150–400)
RBC: 4.47 MIL/uL (ref 3.87–5.11)
RDW: 12.6 % (ref 11.5–15.5)
WBC: 12.3 10*3/uL — ABNORMAL HIGH (ref 4.0–10.5)

## 2014-01-17 LAB — BASIC METABOLIC PANEL
Anion gap: 16 — ABNORMAL HIGH (ref 5–15)
BUN: 12 mg/dL (ref 6–23)
CO2: 23 mEq/L (ref 19–32)
Calcium: 9.8 mg/dL (ref 8.4–10.5)
Chloride: 101 mEq/L (ref 96–112)
Creatinine, Ser: 0.73 mg/dL (ref 0.50–1.10)
GFR calc Af Amer: >90 mL/min (ref >90)
GFR calc non Af Amer: >90 mL/min (ref >90)
Glucose, Bld: 87 mg/dL (ref 70–99)
Potassium: 3.7 mEq/L (ref 3.7–5.3)
Sodium: 140 mEq/L (ref 137–147)

## 2014-01-17 LAB — WET PREP, GENITAL
Clue Cells Wet Prep HPF POC: NONE SEEN
Trich, Wet Prep: NONE SEEN
Yeast Wet Prep HPF POC: NONE SEEN

## 2014-01-17 LAB — URINALYSIS, ROUTINE W REFLEX MICROSCOPIC
Bilirubin Urine: NEGATIVE
Glucose, UA: NEGATIVE mg/dL
Ketones, ur: NEGATIVE mg/dL
Nitrite: NEGATIVE
Protein, ur: NEGATIVE mg/dL
Specific Gravity, Urine: 1.012 (ref 1.005–1.030)
Urobilinogen, UA: 0.2 mg/dL (ref 0.0–1.0)
pH: 5.5 (ref 5.0–8.0)

## 2014-01-17 LAB — URINE MICROSCOPIC-ADD ON

## 2014-01-17 MED ORDER — OXYCODONE-ACETAMINOPHEN 5-325 MG PO TABS
2.0000 | ORAL_TABLET | Freq: Once | ORAL | Status: AC
  Administered 2014-01-17: 2 via ORAL
  Filled 2014-01-17: qty 2

## 2014-01-17 NOTE — ED Notes (Signed)
Per pt sts that for the past few days she has been having some vaginal bleeding. sts that she had a c section 3 weeks ago. sts also some cramping. sts her incision looks okay.

## 2014-01-17 NOTE — ED Provider Notes (Signed)
CSN: [id number]     Arrival date & time 01/17/14  8295 History   First MD Initiated Contact with Patient 01/17/14 [phone]     Chief Complaint  Patient presents with  . Vaginal Bleeding     (Consider location/radiation/quality/duration/timing/severity/associated sxs/prior Treatment) HPI Pt is a 22yo female [id number] presenting to ED 3 weeks s/p c-section c/o lower abdominal pain and cramping, "similar to menstrual cramps but worse" 8/10, radiating into bilateral flanks and lower back, associated with vaginal bleeding that started last night. Pt states she has had a moderate to significant amount of right red blood with large clots since about 8PM last night. Reports using large pads provided to her from [facility] to help keep her clothes and bedding clean. Reports taking percocet and ibuprofen prescribed by her OB/GYN from c-section surgery but denies help with pain. States she feels occasionally lightheaded and thinks it is due to pain medication.  Denies hx of anemia. Denies fever, n/v/d. Denies urinary symptoms.   Past Medical History  Diagnosis Date  . Wears glasses   . Hidradenitis   . Termination of pregnancy 2010, 2013    x 2  . Heartburn in pregnancy   . Migraine     otc med prn   Past Surgical History  Procedure Laterality Date  . Cesarean section N/A 12/28/2013    Procedure: CESAREAN SECTION;  Surgeon: Michael Litter, MD;  Location: [facility];  Service: Obstetrics;  Laterality: N/A;   History reviewed. No pertinent family history. History  Substance Use Topics  . Smoking status: Former Smoker -- 0.10 packs/day for 8 years    Types: Cigarettes    Quit date: 03/15/2013  . Smokeless tobacco: Never Used  . Alcohol Use: No   OB History   Grav Para Term Preterm Abortions TAB SAB Ect Mult Living   4 1 1  0 2 2 0 0 0 1     Review of Systems  Constitutional: Negative for fever, chills, diaphoresis and fatigue.  Respiratory: Negative for cough and shortness of breath.    Cardiovascular: Negative for chest pain and palpitations.  Gastrointestinal: Positive for abdominal pain. Negative for nausea, vomiting, diarrhea and constipation.  Genitourinary: Negative for dysuria and flank pain.  All other systems reviewed and are negative.     Allergies  Monosodium glutamate  Home Medications   Prior to Admission medications   Medication Sig Start Date End Date Taking? Authorizing Provider  docusate sodium (COLACE) 100 MG capsule Take 100 mg by mouth daily.   Yes Historical Provider, MD  ferrous sulfate 325 (65 FE) MG tablet Take 325 mg by mouth daily with breakfast.   Yes Historical Provider, MD  ibuprofen (ADVIL,MOTRIN) 600 MG tablet Take 1 tablet (600 mg total) by mouth every 6 (six) hours. 12/31/13  Yes Gerrit Heck, CNM  lanolin OINT Apply 1 application topically as needed (for breast care). 12/31/13  Yes Gerrit Heck, CNM  OVER THE COUNTER MEDICATION Take 1 tablet by mouth daily. [employer] brand chewable prenatal vitamin   Yes Historical Provider, MD  oxyCODONE-acetaminophen (PERCOCET/ROXICET) 5-325 MG per tablet Take 1-2 tablets by mouth every 4 (four) hours as needed for severe pain (moderate - severe pain). 12/31/13  Yes Gerrit Heck, CNM   BP 105/67  Pulse 63  Temp(Src) 98.3 F (36.8 C) (Oral)  Resp 19  SpO2 98%  LMP 03/29/2013 Physical Exam  Nursing note and vitals reviewed. Constitutional: She appears well-developed and well-nourished. No distress.  HENT:  Head: Normocephalic and atraumatic.  Eyes: Conjunctivae are normal. No scleral icterus.  Neck: Normal range of motion.  Cardiovascular: Normal rate, regular rhythm and normal heart sounds.   Pulmonary/Chest: Effort normal and breath sounds normal. No respiratory distress. She has no wheezes. She has no rales. She exhibits no tenderness.  Abdominal: Soft. Bowel sounds are normal. She exhibits no distension and no mass. There is tenderness. There is no rebound and no guarding.  Well healing  surgical scar consistent with c-section procedure.  No erythema or discharge.  Abd-soft, mild tenderness across lower abdomen. No rebound, guarding or masses  Genitourinary:  Chaperoned exam. Moderate dark red vaginal bleeding with few clots.  Cervical os- blood oozing.  Cervical os minimally opened, finger tip in diameter per Dr. Jodi MourningZavitz. No CMT, adnexal tenderness or masses.    Musculoskeletal: Normal range of motion.  Neurological: She is alert.  Skin: Skin is warm and dry. She is not diaphoretic.    ED Course  Procedures (including critical care time) Labs Review Labs Reviewed  WET PREP, GENITAL - Abnormal; Notable for the following:    WBC, Wet Prep HPF POC TOO NUMEROUS TO COUNT (*)    All other components within normal limits  CBC - Abnormal; Notable for the following:    WBC 12.3 (*)    All other components within normal limits  BASIC METABOLIC PANEL - Abnormal; Notable for the following:    Anion gap 16 (*)    All other components within normal limits  URINALYSIS, ROUTINE W REFLEX MICROSCOPIC - Abnormal; Notable for the following:    Hgb urine dipstick MODERATE (*)    Leukocytes, UA SMALL (*)    All other components within normal limits  URINE MICROSCOPIC-ADD ON - Abnormal; Notable for the following:    Squamous Epithelial / LPF FEW (*)    Bacteria, UA FEW (*)    All other components within normal limits  GC/CHLAMYDIA PROBE AMP    Imaging Review [place] Transvaginal Non-ob  01/17/2014   CLINICAL DATA:  Three weeks postpartum. Status post C-section delivery. Increasing pelvic pain. Question of retained products.  EXAM: TRANSABDOMINAL AND TRANSVAGINAL ULTRASOUND OF PELVIS  TECHNIQUE: Both transabdominal and transvaginal ultrasound examinations of the pelvis were performed. Transabdominal technique was performed for global imaging of the pelvis including uterus, ovaries, adnexal regions, and pelvic cul-de-sac. It was necessary to proceed with endovaginal exam following the  transabdominal exam to visualize the endometrium and ovaries.  COMPARISON:  None  FINDINGS: Uterus  Measurements: 11.8 x 5.4 x 7.5 cm. The endometrial canal appears normal. There is a complex masslike area in the endocervical canal which is most likely a hematoma. Retained products would be unusual after C-section. No vascularity is demonstrated. A C-section scar is noted.  Endometrium  Thickness: Normal thickness. Small amount of endometrial fluid. No focal abnormality visualized.  Right ovary  Measurements: 3.3 x 2.3 x 1.8 cm. Normal appearance/no adnexal mass.  Left ovary  Measurements: 3.0 x 2.3 x 1.2 cm. Normal appearance/no adnexal mass.  Other findings  Small amount of free pelvic fluid.  IMPRESSION: 1. 4.2 x 2.2 x 3.9 cm irregular soft tissue "mass" in the endocervical canal. This is most likely a retained hematoma. 2. Small amount of fluid in the endometrium but no retained products. 3. Normal ovaries. 4. Small amount of free pelvic fluid.   Electronically Signed   By: Loralie ChampagneMark  Gallerani M.D.   On: 01/17/2014 10:50   [place] Pelvis Complete  01/17/2014   CLINICAL DATA:  Three weeks postpartum.  Status post C-section delivery. Increasing pelvic pain. Question of retained products.  EXAM: TRANSABDOMINAL AND TRANSVAGINAL ULTRASOUND OF PELVIS  TECHNIQUE: Both transabdominal and transvaginal ultrasound examinations of the pelvis were performed. Transabdominal technique was performed for global imaging of the pelvis including uterus, ovaries, adnexal regions, and pelvic cul-de-sac. It was necessary to proceed with endovaginal exam following the transabdominal exam to visualize the endometrium and ovaries.  COMPARISON:  None  FINDINGS: Uterus  Measurements: 11.8 x 5.4 x 7.5 cm. The endometrial canal appears normal. There is a complex masslike area in the endocervical canal which is most likely a hematoma. Retained products would be unusual after C-section. No vascularity is demonstrated. A C-section scar is noted.   Endometrium  Thickness: Normal thickness. Small amount of endometrial fluid. No focal abnormality visualized.  Right ovary  Measurements: 3.3 x 2.3 x 1.8 cm. Normal appearance/no adnexal mass.  Left ovary  Measurements: 3.0 x 2.3 x 1.2 cm. Normal appearance/no adnexal mass.  Other findings  Small amount of free pelvic fluid.  IMPRESSION: 1. 4.2 x 2.2 x 3.9 cm irregular soft tissue "mass" in the endocervical canal. This is most likely a retained hematoma. 2. Small amount of fluid in the endometrium but no retained products. 3. Normal ovaries. 4. Small amount of free pelvic fluid.   Electronically Signed   By: Loralie ChampagneMark  Gallerani M.D.   On: 01/17/2014 10:50     EKG Interpretation None      MDM   Final diagnoses:  S/P C-section  Vaginal bleeding    Pt is a 22yo female 3 weeks s/p c-section presenting to ED with lower abdominal cramping and vaginal bleeding that started last night.  Denies fever, n/v/d. Denies urinary symptoms. States this is her first c-section.  Pt has not called OB/GYN as office was closed last night when symptoms started.    CBC: mild leukocytosis. Hgb/Hct: WNL. BMP: WNL Pelvic: cervical os oozing blood.  Pelvic performed by Dr. Jodi MourningZavitz as initial exam, cervical os not visualized due to bleeding. Dr. Jodi MourningZavitz reports cervical os dilated 1 finger tip, minimally opened.    US Pelvis: 4.2 x 2.2 x 3.9 cm irregular soft tissue "mass" in endocervical canal. Consistent with retained hematoma.  Discussed findings with Dr. Jodi MourningZavitz.  Will consult on-call OB/GYN for further instruction.    Consulted with Dr. Su Hiltoberts, on call for Dr. Normand Sloopillard, OB/GYN, pt may f/u as scheduled for post-partum visit.  Strict return precautions provided as well as information for [facility].     Junius Finnerrin O'Malley, PA-C 01/17/14 [phone]

## 2014-01-17 NOTE — ED Notes (Signed)
Pt asked for urine sample. sts she can't go right now.

## 2014-01-17 NOTE — ED Notes (Signed)
Phlebotomy at bedside.

## 2014-01-17 NOTE — ED Provider Notes (Signed)
Medical screening examination/treatment/procedure(s) were conducted as a shared visit with non-physician practitioner(s) or resident and myself. I personally evaluated the patient during the encounter and agree with the findings and plan unless otherwise indicated.  I have personally reviewed any xrays and/ or EKG's with the provider and I agree with interpretation.  Patient status post C-section 3 weeks prior presents with worsening abdominal cramping diffuse and vaginal bleeding. Patient fell vaginal bleeding resolved and then has gradually worsened the past 2 weeks. No fevers chills or urinary symptoms. Baby is healthy and doing okay. On exam patient has mild lower Donald tenderness bilateral and suprapubic, no guarding or rigidity, patient well-appearing. Plan for urinalysis, ultrasound pelvis and reassessment.  On vaginal exam mild oozing of blood, tip of finger in cervix, PA discussed fup with OB. Vaginal bleeding, Abd cramping   Candace SkeensJoshua M Elridge Stemm, MD 01/17/14 1540

## 2014-01-17 NOTE — ED Notes (Signed)
Pt still in radiology.

## 2014-01-17 NOTE — Discharge Instructions (Signed)
Return to [facility] or call your OB/GYN if you experience increased bleeding, specifically having to change your pad every 1 hour, you experience increased abdominal pain, or have other new concerns regarding your recent pregnancy.  See below for further instruction.

## 2014-01-17 NOTE — ED Notes (Signed)
Pt c/o abd cramping and vaginal bleeding, denies fever/n/v/d. Denies other vaginal discharge.

## 2014-01-18 LAB — GC/CHLAMYDIA PROBE AMP
CT Probe RNA: NEGATIVE
GC Probe RNA: NEGATIVE

## 2014-02-02 ENCOUNTER — Encounter: Payer: Self-pay | Admitting: Medical

## 2014-02-02 ENCOUNTER — Telehealth: Payer: Self-pay | Admitting: Medical

## 2014-02-02 ENCOUNTER — Ambulatory Visit (INDEPENDENT_AMBULATORY_CARE_PROVIDER_SITE_OTHER): Payer: PPO | Admitting: Medical

## 2014-02-02 VITALS — BP 102/80 | HR 68 | Temp 98.5°F | Resp 16 | Ht 65.5 in | Wt 156.0 lb

## 2014-02-02 DIAGNOSIS — Z0000 Encounter for general adult medical examination without abnormal findings: Secondary | ICD-10-CM

## 2014-02-02 DIAGNOSIS — L732 Hidradenitis suppurativa: Secondary | ICD-10-CM

## 2014-02-02 DIAGNOSIS — Z00129 Encounter for routine child health examination without abnormal findings: Secondary | ICD-10-CM

## 2014-02-02 NOTE — Telephone Encounter (Signed)
I fax her [facility] to [facility] and they will contact her for an appointment. CLS

## 2014-02-02 NOTE — Telephone Encounter (Signed)
Refer to [facility] ASAP for hidradenitis suppurative, currently with painful left axilla nodule, that wont' go away, recently had I&D

## 2014-02-02 NOTE — Progress Notes (Signed)
Subjective:   HPI  Candace Pearson is a 23 y.o. female who presents for a complete physical.  Needs vaccine form completed for school.   Recently had her baby in June by C-section, with obstetric[facility].  Preventative care: Last ophthalmology visit:n/a Last dental visit:n/a Last colonoscopy:n/a Last mammogram:n/a Last gynecological exam:? Last EKG:n/a Last labs:no labs here but at the hospital  Prior vaccinations: TD or Tdap: up to date Influenza:n/a Pneumococcal:n/a Shingles/Zostavax:n/a  Advanced directive:n/a Health care power of attorney:n/a Living will:n/a  Concerns: Has swollen painful gland under left arm, has had this before, hx/o hidradenitis.  Has long hx/o hidradenitis, left axilla lesions don't ever go away.   Has had I&D prior.  Current one very painful, already had I&D from emergency dept recently.  Reviewed their medical, surgical, family, social, medication, and allergy history and updated chart as appropriate.  Past Medical History  Diagnosis Date  . Wears glasses   . Hidradenitis   . Termination of pregnancy 2010, 2013    x 2  . Heartburn in pregnancy   . Migraine     otc med prn    Past Surgical History  Procedure Laterality Date  . Cesarean section N/A 12/28/2013    Procedure: CESAREAN SECTION;  Surgeon: Michael LitterNaima A Dillard, MD;  Location: [facility];  Service: Obstetrics;  Laterality: N/A;    History   Social History  . Marital Status: Single    Spouse Name: N/A    Number of Children: N/A  . Years of Education: N/A   Occupational History  . Not on file.   Social History Main Topics  . Smoking status: Former Smoker -- 0.10 packs/day for 8 years    Types: Cigarettes    Quit date: 03/15/2013  . Smokeless tobacco: Never Used  . Alcohol Use: No     Comment: marijuana in the past  . Drug Use: No     Comment: last use 03/2013  . Sexual Activity: Yes    Birth Control/ Protection: None     Comment: Pregnant   Other Topics  Concern  . Not on file   Social History Narrative   Lives with mother-in-law, fiance and baby girl infant born in June 2015;  Full time student [employer], criminal justice and sociology, exercises.       Family History  Problem Relation Age of Onset  . Cancer Neg Hx   . Heart disease Neg Hx   . Diabetes Neg Hx   . Stroke Neg Hx   . Hypertension Neg Hx     Current outpatient prescriptions:docusate sodium (COLACE) 100 MG capsule, Take 100 mg by mouth daily., Disp: , Rfl: ;  ferrous sulfate 325 (65 FE) MG tablet, Take 325 mg by mouth daily with breakfast., Disp: , Rfl: ;  ibuprofen (ADVIL,MOTRIN) 600 MG tablet, Take 1 tablet (600 mg total) by mouth every 6 (six) hours., Disp: 30 tablet, Rfl: 0 OVER THE COUNTER MEDICATION, Take 1 tablet by mouth daily. [employer] brand chewable prenatal vitamin, Disp: , Rfl: ;  oxyCODONE-acetaminophen (PERCOCET/ROXICET) 5-325 MG per tablet, Take 1-2 tablets by mouth every 4 (four) hours as needed for severe pain (moderate - severe pain)., Disp: 30 tablet, Rfl: 0  Allergies  Allergen Reactions  . Monosodium Glutamate Hives and Swelling    Lips swell    Review of Systems Constitutional: -fever, -chills, -sweats, -unexpected weight change, -decreased appetite, -fatigue Allergy: -sneezing, -itching, -congestion Dermatology: -changing moles, --rash, +lumps(under armpit) ENT: -runny nose, -ear pain, -sore  throat, -hoarseness, -sinus pain, -teeth pain, - ringing in ears, -hearing loss, -nosebleeds Cardiology: -chest pain, -palpitations, -swelling, -difficulty breathing when lying flat, -waking up short of breath Respiratory: -cough, -shortness of breath, -difficulty breathing with exercise or exertion, -wheezing, -coughing up blood Gastroenterology: -abdominal pain, -nausea, -vomiting, -diarrhea, -constipation, -blood in stool, -changes in bowel movement, -difficulty swallowing or eating Hematology: -bleeding, -bruising  Musculoskeletal: -joint aches,  -muscle aches, -joint swelling, -back pain, -neck pain, -cramping, -changes in gait Ophthalmology: denies vision changes, eye redness, itching, discharge Urology: -burning with urination, -difficulty urinating, -blood in urine, -urinary frequency, -urgency, -incontinence Neurology: -headache, -weakness, -tingling, -numbness, -memory loss, -falls, -dizziness Psychology: -depressed mood, -agitation, -sleep problems     Objective:   Physical Exam  BP 102/80  Pulse 68  Temp(Src) 98.5 F (36.9 C) (Oral)  Resp 16  Ht 5' 5.5" (1.664 m)  Wt 156 lb (70.761 kg)  BMI 25.56 kg/m2  Breastfeeding? Yes  General appearance: alert, no distress, WD/WN, black female Skin: nodular tender swollen skin lesions in left axilla c/w hidradenitis suppurative, old scarring in bilat axillar from prior similar.   HEENT: normocephalic, conjunctiva/corneas normal, sclerae anicteric, PERRLA, EOMi, nares patent, no discharge or erythema, pharynx normal Oral cavity: MMM, tongue normal, teeth in good repair Neck: supple, no lymphadenopathy, no thyromegaly, no masses, normal ROM Chest: non tender, normal shape and expansion Heart: RRR, normal S1, S2, no murmurs Lungs: CTA bilaterally, no wheezes, rhonchi, or rales Abdomen: +bs, soft, mild lower abdominal tenderness from recent C -section.   otherwise non tender, non distended, no masses, no hepatomegaly, no splenomegaly, no bruits Back: non tender, normal ROM, no scoliosis Musculoskeletal: upper extremities non tender, no obvious deformity, normal ROM throughout, lower extremities non tender, no obvious deformity, normal ROM throughout Extremities: no edema, no cyanosis, no clubbing Pulses: 2+ symmetric, upper and lower extremities, normal cap refill Neurological: alert, oriented x 3, CN2-12 intact, strength normal upper extremities and lower extremities, sensation normal throughout, DTRs 2+ throughout, no cerebellar signs, gait normal Psychiatric: normal affect,  behavior normal, pleasant  Breast/gyn/rectal - deferred to gyn   Assessment and Plan :    Encounter Diagnoses  Name Primary?  . Routine infant or child health check Yes  . Hidradenitis suppurativa   . Lactating mother     Physical exam - discussed healthy lifestyle, diet, exercise, preventative care, vaccinations, and addressed their concerns.  Handout given.  See eye doctor and dentist and gyn yearly.  She will get me copies of her vaccines to review.  I can't complete her form without vaccine records.   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  Follow-up pending call back

## 2014-02-15 ENCOUNTER — Encounter: Payer: Self-pay | Admitting: Medical

## 2014-02-17 ENCOUNTER — Telehealth: Payer: Self-pay | Admitting: Medical

## 2014-02-17 NOTE — Telephone Encounter (Signed)
lm

## 2014-02-21 ENCOUNTER — Ambulatory Visit (INDEPENDENT_AMBULATORY_CARE_PROVIDER_SITE_OTHER): Payer: 59 | Admitting: Surgery

## 2014-02-21 ENCOUNTER — Encounter (INDEPENDENT_AMBULATORY_CARE_PROVIDER_SITE_OTHER): Payer: Self-pay | Admitting: Surgery

## 2014-02-21 VITALS — BP 116/66 | HR 82 | Temp 98.5°F | Ht 65.0 in | Wt 161.0 lb

## 2014-02-21 DIAGNOSIS — L732 Hidradenitis suppurativa: Secondary | ICD-10-CM

## 2014-02-21 MED ORDER — CEPHALEXIN 500 MG PO CAPS: 500.0000 mg | ORAL_CAPSULE | Freq: Four times a day (QID) | ORAL | Status: AC

## 2014-02-21 NOTE — Patient Instructions (Signed)
Hidradenitis Suppurativa, Sweat Gland Abscess Hidradenitis suppurativa is a long lasting (chronic), uncommon disease of the sweat glands. With this, boil-like lumps and scarring develop in the groin, some times under the arms (axillae), and under the breasts. It may also uncommonly occur behind the ears, in the crease of the buttocks, and around the genitals.  CAUSES  The cause is from a blocking of the sweat glands. They then become infected. It may cause drainage and odor. It is not contagious. So it cannot be given to someone else. It most often shows up in puberty (about 10 to 25 years of age). But it may happen much later. It is similar to acne which is a disease of the sweat glands. This condition is slightly more common in African-Americans and women. SYMPTOMS   Hidradenitis usually starts as one or more red, tender, swellings in the groin or under the arms (axilla).  Over a period of hours to days the lesions get larger. They often open to the skin surface, draining clear to yellow-colored fluid.  The infected area heals with scarring. DIAGNOSIS  Your caregiver makes this diagnosis by looking at you. Sometimes cultures (growing germs on plates in the lab) may be taken. This is to see what germ (bacterium) is causing the infection.  TREATMENT   Topical germ killing medicine applied to the skin (antibiotics) are the treatment of choice. Antibiotics taken by mouth (systemic) are sometimes needed when the condition is getting worse or is severe.  Avoid tight-fitting clothing which traps moisture in.  Dirt does not cause hidradenitis and it is not caused by poor hygiene.  Involved areas should be cleaned daily using an antibacterial soap. Some patients find that the liquid form of Lever 2000, applied to the involved areas as a lotion after bathing, can help reduce the odor related to this condition.  Sometimes surgery is needed to drain infected areas or remove scarred tissue. Removal of  large amounts of tissue is used only in severe cases.  Birth control pills may be helpful.  Oral retinoids (vitamin A derivatives) for 6 to 12 months which are effective for acne may also help this condition.  Weight loss will improve but not cure hidradenitis. It is made worse by being overweight. But the condition is not caused by being overweight.  This condition is more common in people who have had acne.  It may become worse under stress. There is no medical cure for hidradenitis. It can be controlled, but not cured. The condition usually continues for years with periods of getting worse and getting better (remission). Document Released: 02/13/2004 Document Revised: 09/23/2011 Document Reviewed: 10/01/2013 Exit[employer] Patient Information 2015 [employer]. This information is not intended to replace advice given to you by your health care provider. Make sure you discuss any questions you have with your health care provider.  

## 2014-02-21 NOTE — Progress Notes (Signed)
Patient ID: Candace BalloonBriony Pearson, female   DOB: [birth year], [age] y.o.   MRN: [id number]  Chief Complaint  Patient presents with  . bilateral hydradenitis axila    HPI Candace Pearson is a 23 y.o. female.   HPI Patient sent at the request of Jac CanavanDavid S Tysinger, PA-C  For bilateral axillary hidradenitis. Patient has had this condition for many years but has been exacerbated by recent pregnancy. She is open areas that are tender and drain in both axilla. She's had multiple incision and drainage procedures and antibiotics. She is searching for a more definitive treatment of his condition. Currently, her right axilla is doing well with no evidence of flare. Her left axilla draining and causing mild to moderate discomfort. Denies fever or chills. Past Medical History  Diagnosis Date  . Wears glasses   . Hidradenitis   . Termination of pregnancy 2010, 2013    x 2  . Heartburn in pregnancy   . Migraine     otc med prn    Past Surgical History  Procedure Laterality Date  . Cesarean section N/A 12/28/2013    Procedure: CESAREAN SECTION;  Surgeon: Michael LitterNaima A Dillard, MD;  Location: [facility];  Service: Obstetrics;  Laterality: N/A;    Family History  Problem Relation Age of Onset  . Cancer Neg Hx   . Heart disease Neg Hx   . Diabetes Neg Hx   . Stroke Neg Hx   . Hypertension Neg Hx     Social History History  Substance Use Topics  . Smoking status: Former Smoker -- 0.10 packs/day for 8 years    Types: Cigarettes    Quit date: 03/15/2013  . Smokeless tobacco: Never Used  . Alcohol Use: No     Comment: marijuana in the past    Allergies  Allergen Reactions  . Monosodium Glutamate Hives and Swelling    Lips swell    Current Outpatient Prescriptions  Medication Sig Dispense Refill  . ibuprofen (ADVIL,MOTRIN) 600 MG tablet Take 1 tablet (600 mg total) by mouth every 6 (six) hours.  30 tablet  0  . cephALEXin (KEFLEX) 500 MG capsule Take 1 capsule (500 mg total) by mouth 4 (four) times daily.  40  capsule  0   No current facility-administered medications for this visit.    Review of Systems Review of Systems  Constitutional: Negative for fever, chills and unexpected weight change.  HENT: Negative for congestion, hearing loss, sore throat, trouble swallowing and voice change.   Eyes: Negative for visual disturbance.  Respiratory: Negative for cough and wheezing.   Cardiovascular: Negative for chest pain, palpitations and leg swelling.  Gastrointestinal: Negative for nausea, vomiting, abdominal pain, diarrhea, constipation, blood in stool, abdominal distention and anal bleeding.  Genitourinary: Negative for hematuria, vaginal bleeding and difficulty urinating.  Musculoskeletal: Negative for arthralgias.  Skin: Negative for rash and wound.  Neurological: Negative for seizures, syncope and headaches.  Hematological: Negative for adenopathy. Does not bruise/bleed easily.  Psychiatric/Behavioral: Negative for confusion.    Blood pressure 116/66, pulse 82, temperature 98.5 F (36.9 C), temperature source Oral, height 5\' 5"  (1.651 m), weight 161 lb (73.029 kg), SpO2 98.00%, currently breastfeeding.  Physical Exam Physical Exam  Constitutional: She is oriented to person, place, and time. She appears well-developed and well-nourished.  HENT:  Head: Normocephalic.  Mouth/Throat: No oropharyngeal exudate.  Eyes: Pupils are equal, round, and reactive to light. Scleral icterus is present.  Neck: Normal range of motion.  Cardiovascular: Normal rate.   Pulmonary/Chest:  Effort normal and breath sounds normal.  Neurological: She is alert and oriented to person, place, and time.  Skin:     Psychiatric: She has a normal mood and affect. Her behavior is normal. Thought content normal.    Data Reviewed none  Assessment    Hidradenitis left axilla 6 cm x 3 cm without active infection    Plan    Recommendations it left axilla hidradenitis. Risks, benefits and alternatives discussed.  Risk of bleeding, infection, stricture, pain, discomfort, recurrence, and any further operations discussed.The procedure has been discussed with the patient.  Alternative therapies have been discussed with the patient.  Operative risks include bleeding,  Infection,  Organ injury,  Nerve injury,  Blood vessel injury,  DVT,  Pulmonary embolism,  Death,  And possible reoperation.  Medical management risks include worsening of present situation.  The success of the procedure is 50 -90 % at treating patients symptoms.  The patient understands and agrees to proceed.   Place on keflex 500 mg QID for two weeks prior to surgery.     Royale Lennartz A. 02/21/2014, 4:01 PM

## 2014-03-14 ENCOUNTER — Telehealth: Payer: Self-pay | Admitting: Internal Medicine

## 2014-03-14 NOTE — Telephone Encounter (Signed)
Records from Dr. Carleene Cooper office came in

## 2014-03-28 NOTE — Pre-Procedure Instructions (Signed)
KENNIYA WESTRICH  03/28/2014   Your procedure is scheduled on:  Thursday September 17 th at 1030 AM  Report to [facility] Admitting at 0830 AM.  Call this number if you have problems the morning of surgery: [phone]   Remember:   Do not eat food or drink liquids after midnight.   Take these medicines the morning of surgery with A SIP OF WATER: None Stop Ibuprofen now  Do not wear jewelry, make-up or nail polish.  Do not wear lotions, powders, or perfumes. You may wear deodorant.  Do not shave 48 hours prior to surgery.   Do not bring valuables to the hospital.  [facility] is not responsible  for any belongings or valuables.               Contacts, dentures or bridgework may not be worn into surgery.  Leave suitcase in the car. After surgery it may be brought to your room.  For patients admitted to the hospital, discharge time is determined by your  treatment team.               Patients discharged the day of surgery will not be allowed to drive home.    Special Instructions: [place] - Preparing for Surgery  Before surgery, you can play an important role.  Because skin is not sterile, your skin needs to be as free of germs as possible.  You can reduce the number of germs on you skin by washing with CHG (chlorahexidine gluconate) soap before surgery.  CHG is an antiseptic cleaner which kills germs and bonds with the skin to continue killing germs even after washing.  Please DO NOT use if you have an allergy to CHG or antibacterial soaps.  If your skin becomes reddened/irritated stop using the CHG and inform your nurse when you arrive at Short Stay.  Do not shave (including legs and underarms) for at least 48 hours prior to the first CHG shower.  You may shave your face.  Please follow these instructions carefully:   1.  Shower with CHG Soap the night before surgery and the                                morning of Surgery.  2.  If you choose to wash your hair, wash  your hair first as usual with your       normal shampoo.  3.  After you shampoo, rinse your hair and body thoroughly to remove the                      Shampoo.  4.  Use CHG as you would any other liquid soap.  You can apply chg directly       to the skin and wash gently with scrungie or a clean washcloth.  5.  Apply the CHG Soap to your body ONLY FROM THE NECK DOWN.        Do not use on open wounds or open sores.  Avoid contact with your eyes,       ears, mouth and genitals (private parts).  Wash genitals (private parts)       with your normal soap.  6.  Wash thoroughly, paying special attention to the area where your surgery        will be performed.  7.  Thoroughly rinse your body with warm water from  the neck down.  8.  DO NOT shower/wash with your normal soap after using and rinsing off       the CHG Soap.  9.  Pat yourself dry with a clean towel.            10.  Wear clean pajamas.            11.  Place clean sheets on your bed the night of your first shower and do not        sleep with pets.  Day of Surgery  Do not apply any lotions/deoderants the morning of surgery.  Please wear clean clothes to the hospital/surgery center.      Please read over the following fact sheets that you were given: Pain Booklet, Coughing and Deep Breathing and Surgical Site Infection Prevention

## 2014-03-29 ENCOUNTER — Encounter (HOSPITAL_COMMUNITY)
Admission: RE | Admit: 2014-03-29 | Discharge: 2014-03-29 | Disposition: A | Discharge status: Home / Self Care | Payer: Medicaid Other | Source: Ambulatory Visit | Attending: Surgery | Admitting: Surgery

## 2014-03-29 ENCOUNTER — Encounter (HOSPITAL_COMMUNITY): Payer: Self-pay

## 2014-03-29 ENCOUNTER — Encounter: Payer: Self-pay | Admitting: Medical

## 2014-03-29 DIAGNOSIS — Z87891 Personal history of nicotine dependence: Secondary | ICD-10-CM | POA: Diagnosis not present

## 2014-03-29 DIAGNOSIS — L732 Hidradenitis suppurativa: Secondary | ICD-10-CM | POA: Diagnosis not present

## 2014-03-29 LAB — CBC WITH DIFFERENTIAL/PLATELET
Basophils Absolute: 0 10*3/uL (ref 0.0–0.1)
Basophils Relative: 1 % (ref 0–1)
EOS ABS: 0.1 10*3/uL (ref 0.0–0.7)
Eosinophils Relative: 2 % (ref 0–5)
HCT: 40.8 % (ref 36.0–46.0)
HEMOGLOBIN: 13.7 g/dL (ref 12.0–15.0)
Lymphocytes Relative: 28 % (ref 12–46)
Lymphs Abs: 1.8 10*3/uL (ref 0.7–4.0)
MCH: 28.7 pg (ref 26.0–34.0)
MCHC: 33.6 g/dL (ref 30.0–36.0)
MCV: 85.4 fL (ref 78.0–100.0)
MONO ABS: 0.4 10*3/uL (ref 0.1–1.0)
Monocytes Relative: 7 % (ref 3–12)
NEUTROS PCT: 62 % (ref 43–77)
Neutro Abs: 4 10*3/uL (ref 1.7–7.7)
Platelets: 251 10*3/uL (ref 150–400)
RBC: 4.78 MIL/uL (ref 3.87–5.11)
RDW: 12.7 % (ref 11.5–15.5)
WBC: 6.3 10*3/uL (ref 4.0–10.5)

## 2014-03-29 LAB — COMPREHENSIVE METABOLIC PANEL
ALBUMIN: 3.5 g/dL (ref 3.5–5.2)
ALT: 12 U/L (ref 0–35)
ANION GAP: 12 (ref 5–15)
AST: 17 U/L (ref 0–37)
Alkaline Phosphatase: 64 U/L (ref 39–117)
BUN: 13 mg/dL (ref 6–23)
CO2: 25 mEq/L (ref 19–32)
CREATININE: 0.84 mg/dL (ref 0.50–1.10)
Calcium: 9.6 mg/dL (ref 8.4–10.5)
Chloride: 103 mEq/L (ref 96–112)
GFR calc Af Amer: >90 mL/min (ref >90)
GFR calc non Af Amer: >90 mL/min (ref >90)
Glucose, Bld: 81 mg/dL (ref 70–99)
POTASSIUM: 4 meq/L (ref 3.7–5.3)
Sodium: 140 mEq/L (ref 137–147)
TOTAL PROTEIN: 7.8 g/dL (ref 6.0–8.3)
Total Bilirubin: <0.2 mg/dL — ABNORMAL LOW (ref 0.3–1.2)

## 2014-03-29 LAB — HCG, SERUM, QUALITATIVE: PREG SERUM: NEGATIVE

## 2014-03-29 MED ORDER — CHLORHEXIDINE GLUCONATE 4 % EX LIQD: 1.0000 "application " | Freq: Once | CUTANEOUS | Status: DC

## 2014-03-30 MED ORDER — CEFAZOLIN SODIUM-DEXTROSE 2-3 GM-% IV SOLR
2.0000 g | INTRAVENOUS | Status: AC
  Administered 2014-03-31: 2 g via INTRAVENOUS
  Filled 2014-03-30: qty 50

## 2014-03-31 ENCOUNTER — Ambulatory Visit (HOSPITAL_COMMUNITY): Payer: Medicaid Other | Admitting: Anesthesiology

## 2014-03-31 ENCOUNTER — Ambulatory Visit (HOSPITAL_COMMUNITY)
Admission: RE | Admit: 2014-03-31 | Discharge: 2014-03-31 | Disposition: A | Discharge status: Home / Self Care | Payer: Medicaid Other | Source: Ambulatory Visit | Attending: Surgery | Admitting: Surgery

## 2014-03-31 ENCOUNTER — Encounter (HOSPITAL_COMMUNITY)
Admission: RE | Disposition: A | Discharge status: Home / Self Care | Payer: Self-pay | Source: Ambulatory Visit | Attending: Surgery

## 2014-03-31 ENCOUNTER — Encounter (HOSPITAL_COMMUNITY): Payer: Self-pay | Admitting: Anesthesiology

## 2014-03-31 ENCOUNTER — Encounter (HOSPITAL_COMMUNITY): Payer: Medicaid Other | Admitting: Anesthesiology

## 2014-03-31 DIAGNOSIS — Z87891 Personal history of nicotine dependence: Secondary | ICD-10-CM | POA: Insufficient documentation

## 2014-03-31 DIAGNOSIS — L732 Hidradenitis suppurativa: Secondary | ICD-10-CM | POA: Insufficient documentation

## 2014-03-31 HISTORY — PX: HYDRADENITIS EXCISION: SHX5243

## 2014-03-31 SURGERY — EXCISION, HIDRADENITIS, AXILLA
Anesthesia: General | Laterality: Left

## 2014-03-31 MED ORDER — LIDOCAINE HCL (CARDIAC) 20 MG/ML IV SOLN
INTRAVENOUS | Status: AC
  Filled 2014-03-31: qty 5

## 2014-03-31 MED ORDER — MIDAZOLAM HCL 5 MG/5ML IJ SOLN
INTRAMUSCULAR | Status: DC | PRN
  Administered 2014-03-31: 2 mg via INTRAVENOUS

## 2014-03-31 MED ORDER — MIDAZOLAM HCL 2 MG/2ML IJ SOLN
INTRAMUSCULAR | Status: AC
  Filled 2014-03-31: qty 2

## 2014-03-31 MED ORDER — ARTIFICIAL TEARS OP OINT
TOPICAL_OINTMENT | OPHTHALMIC | Status: AC
  Filled 2014-03-31: qty 3.5

## 2014-03-31 MED ORDER — LIDOCAINE-EPINEPHRINE (PF) 1 %-1:200000 IJ SOLN
INTRAMUSCULAR | Status: AC
  Filled 2014-03-31: qty 10

## 2014-03-31 MED ORDER — PROPOFOL 10 MG/ML IV BOLUS
INTRAVENOUS | Status: AC
  Filled 2014-03-31: qty 20

## 2014-03-31 MED ORDER — HYDROMORPHONE HCL 1 MG/ML IJ SOLN
0.2500 mg | INTRAMUSCULAR | Status: DC | PRN
  Administered 2014-03-31 (×4): 0.5 mg via INTRAVENOUS

## 2014-03-31 MED ORDER — ONDANSETRON HCL 4 MG/2ML IJ SOLN
INTRAMUSCULAR | Status: DC | PRN
  Administered 2014-03-31: 4 mg via INTRAVENOUS

## 2014-03-31 MED ORDER — HYDROMORPHONE HCL 1 MG/ML IJ SOLN
INTRAMUSCULAR | Status: AC
  Filled 2014-03-31: qty 1

## 2014-03-31 MED ORDER — PHENYLEPHRINE HCL 10 MG/ML IJ SOLN
INTRAMUSCULAR | Status: DC | PRN
  Administered 2014-03-31 (×2): 80 ug via INTRAVENOUS

## 2014-03-31 MED ORDER — ROCURONIUM BROMIDE 50 MG/5ML IV SOLN
INTRAVENOUS | Status: AC
  Filled 2014-03-31: qty 1

## 2014-03-31 MED ORDER — MIDAZOLAM HCL 2 MG/2ML IJ SOLN: 0.5000 mg | Freq: Once | INTRAMUSCULAR | Status: DC | PRN

## 2014-03-31 MED ORDER — DIPHENHYDRAMINE HCL 50 MG/ML IJ SOLN
10.0000 mg | Freq: Once | INTRAMUSCULAR | Status: AC
  Administered 2014-03-31: 12.5 mg via INTRAVENOUS

## 2014-03-31 MED ORDER — OXYCODONE HCL 5 MG/5ML PO SOLN: 5.0000 mg | Freq: Once | ORAL | Status: AC | PRN

## 2014-03-31 MED ORDER — OXYCODONE HCL 5 MG PO TABS
ORAL_TABLET | ORAL | Status: AC
  Filled 2014-03-31: qty 1

## 2014-03-31 MED ORDER — GLYCOPYRROLATE 0.2 MG/ML IJ SOLN
INTRAMUSCULAR | Status: DC | PRN
  Administered 2014-03-31: 0.2 mg via INTRAVENOUS

## 2014-03-31 MED ORDER — FENTANYL CITRATE 0.05 MG/ML IJ SOLN
INTRAMUSCULAR | Status: AC
  Filled 2014-03-31: qty 5

## 2014-03-31 MED ORDER — DOXYCYCLINE HYCLATE 50 MG PO CAPS: 100.0000 mg | ORAL_CAPSULE | Freq: Two times a day (BID) | ORAL | Status: DC

## 2014-03-31 MED ORDER — ONDANSETRON HCL 4 MG/2ML IJ SOLN
INTRAMUSCULAR | Status: AC
  Filled 2014-03-31: qty 2

## 2014-03-31 MED ORDER — LIDOCAINE-EPINEPHRINE (PF) 1 %-1:200000 IJ SOLN
INTRAMUSCULAR | Status: DC | PRN
  Administered 2014-03-31: 10 mL via INTRADERMAL

## 2014-03-31 MED ORDER — PROPOFOL 10 MG/ML IV BOLUS
INTRAVENOUS | Status: DC | PRN
  Administered 2014-03-31: 150 mg via INTRAVENOUS

## 2014-03-31 MED ORDER — DEXAMETHASONE SODIUM PHOSPHATE 4 MG/ML IJ SOLN
INTRAMUSCULAR | Status: DC | PRN
  Administered 2014-03-31: 4 mg via INTRAVENOUS

## 2014-03-31 MED ORDER — FENTANYL CITRATE 0.05 MG/ML IJ SOLN
INTRAMUSCULAR | Status: DC | PRN
  Administered 2014-03-31: 100 ug via INTRAVENOUS

## 2014-03-31 MED ORDER — MEPERIDINE HCL 25 MG/ML IJ SOLN: 6.2500 mg | INTRAMUSCULAR | Status: DC | PRN

## 2014-03-31 MED ORDER — OXYCODONE-ACETAMINOPHEN 5-325 MG PO TABS: 1.0000 | ORAL_TABLET | ORAL | Status: DC | PRN

## 2014-03-31 MED ORDER — 0.9 % SODIUM CHLORIDE (POUR BTL) OPTIME
TOPICAL | Status: DC | PRN
  Administered 2014-03-31: 1000 mL

## 2014-03-31 MED ORDER — PROMETHAZINE HCL 25 MG/ML IJ SOLN: 6.2500 mg | INTRAMUSCULAR | Status: DC | PRN

## 2014-03-31 MED ORDER — LIDOCAINE HCL (CARDIAC) 10 MG/ML IV SOLN
INTRAVENOUS | Status: DC | PRN
  Administered 2014-03-31: 20 mg via INTRAVENOUS

## 2014-03-31 MED ORDER — LACTATED RINGERS IV SOLN
INTRAVENOUS | Status: DC
  Administered 2014-03-31: 09:00:00 via INTRAVENOUS

## 2014-03-31 MED ORDER — OXYCODONE HCL 5 MG PO TABS
5.0000 mg | ORAL_TABLET | Freq: Once | ORAL | Status: AC | PRN
  Administered 2014-03-31: 5 mg via ORAL

## 2014-03-31 MED ORDER — GLYCOPYRROLATE 0.2 MG/ML IJ SOLN
INTRAMUSCULAR | Status: AC
  Filled 2014-03-31: qty 1

## 2014-03-31 SURGICAL SUPPLY — 55 items
APPLIER CLIP 9.375 MED OPEN (MISCELLANEOUS) ×3
BLADE SURG 10 STRL SS (BLADE) ×3 IMPLANT
BLADE SURG 15 STRL LF DISP TIS (BLADE) ×1 IMPLANT
BLADE SURG 15 STRL SS (BLADE) ×2
BLADE SURG ROTATE 9660 (MISCELLANEOUS) IMPLANT
CANISTER SUCTION 2500CC (MISCELLANEOUS) ×3 IMPLANT
CHLORAPREP W/TINT 26ML (MISCELLANEOUS) ×3 IMPLANT
CLIP APPLIE 9.375 MED OPEN (MISCELLANEOUS) ×1 IMPLANT
CONT SPEC 4OZ CLIKSEAL STRL BL (MISCELLANEOUS) IMPLANT
COVER SURGICAL LIGHT HANDLE (MISCELLANEOUS) ×3 IMPLANT
DECANTER SPIKE VIAL GLASS SM (MISCELLANEOUS) ×3 IMPLANT
DERMABOND ADVANCED (GAUZE/BANDAGES/DRESSINGS) ×2
DERMABOND ADVANCED .7 DNX12 (GAUZE/BANDAGES/DRESSINGS) ×1 IMPLANT
DRAPE LAPAROTOMY TRNSV 102X78 (DRAPE) ×3 IMPLANT
DRAPE PED LAPAROTOMY (DRAPES) ×3 IMPLANT
DRAPE UTILITY 15X26 W/TAPE STR (DRAPE) ×6 IMPLANT
DRSG PAD ABDOMINAL 8X10 ST (GAUZE/BANDAGES/DRESSINGS) IMPLANT
ELECT CAUTERY BLADE 6.4 (BLADE) ×3 IMPLANT
ELECT REM PT RETURN 9FT ADLT (ELECTROSURGICAL) ×3
ELECTRODE REM PT RTRN 9FT ADLT (ELECTROSURGICAL) ×1 IMPLANT
GAUZE SPONGE 4X4 12PLY STRL (GAUZE/BANDAGES/DRESSINGS) ×6 IMPLANT
GAUZE SPONGE 4X4 16PLY XRAY LF (GAUZE/BANDAGES/DRESSINGS) ×3 IMPLANT
GLOVE BIO SURGEON STRL SZ8 (GLOVE) ×3 IMPLANT
GLOVE BIOGEL PI IND STRL 7.0 (GLOVE) ×2 IMPLANT
GLOVE BIOGEL PI IND STRL 8 (GLOVE) ×1 IMPLANT
GLOVE BIOGEL PI INDICATOR 7.0 (GLOVE) ×4
GLOVE BIOGEL PI INDICATOR 8 (GLOVE) ×2
GLOVE SURG SS PI 7.0 STRL IVOR (GLOVE) ×3 IMPLANT
GOWN STRL REUS W/ TWL LRG LVL3 (GOWN DISPOSABLE) ×2 IMPLANT
GOWN STRL REUS W/ TWL XL LVL3 (GOWN DISPOSABLE) ×1 IMPLANT
GOWN STRL REUS W/TWL LRG LVL3 (GOWN DISPOSABLE) ×4
GOWN STRL REUS W/TWL XL LVL3 (GOWN DISPOSABLE) ×2
KIT BASIN OR (CUSTOM PROCEDURE TRAY) ×3 IMPLANT
KIT ROOM TURNOVER OR (KITS) ×3 IMPLANT
NEEDLE HYPO 25GX1X1/2 BEV (NEEDLE) IMPLANT
NS IRRIG 1000ML POUR BTL (IV SOLUTION) ×3 IMPLANT
PACK SURGICAL SETUP 50X90 (CUSTOM PROCEDURE TRAY) ×3 IMPLANT
PAD ARMBOARD 7.5X6 YLW CONV (MISCELLANEOUS) ×6 IMPLANT
PENCIL BUTTON HOLSTER BLD 10FT (ELECTRODE) ×3 IMPLANT
SPONGE GAUZE 4X4 12PLY STER LF (GAUZE/BANDAGES/DRESSINGS) ×3 IMPLANT
SPONGE LAP 4X18 X RAY DECT (DISPOSABLE) IMPLANT
SUT MNCRL AB 4-0 PS2 18 (SUTURE) ×3 IMPLANT
SUT VIC AB 0 CT2 27 (SUTURE) ×6 IMPLANT
SUT VIC AB 3-0 SH 27 (SUTURE) ×2
SUT VIC AB 3-0 SH 27XBRD (SUTURE) ×1 IMPLANT
SUT VICRYL AB 3 0 TIES (SUTURE) ×3 IMPLANT
SYR BULB 3OZ (MISCELLANEOUS) ×3 IMPLANT
SYR CONTROL 10ML LL (SYRINGE) IMPLANT
TAPE CLOTH SURG 4X10 WHT LF (GAUZE/BANDAGES/DRESSINGS) ×3 IMPLANT
TOWEL OR 17X24 6PK STRL BLUE (TOWEL DISPOSABLE) ×3 IMPLANT
TOWEL OR 17X26 10 PK STRL BLUE (TOWEL DISPOSABLE) ×3 IMPLANT
TUBE CONNECTING 12'X1/4 (SUCTIONS) ×1
TUBE CONNECTING 12X1/4 (SUCTIONS) ×2 IMPLANT
WATER STERILE IRR 1000ML POUR (IV SOLUTION) ×3 IMPLANT
YANKAUER SUCT BULB TIP NO VENT (SUCTIONS) ×3 IMPLANT

## 2014-03-31 NOTE — Anesthesia Postprocedure Evaluation (Signed)
  Anesthesia Post-op Note  Patient: Candace Pearson  Procedure(s) Performed: Procedure(s): EXCISION LEFT AXILLA HIDRADENITIS  (Left)  Patient Location: PACU  Anesthesia Type:General  Level of Consciousness: awake, alert , oriented and patient cooperative  Airway and Oxygen Therapy: Patient Spontanous Breathing  Post-op Pain: none  Post-op Assessment: Post-op Vital signs reviewed, Patient's Cardiovascular Status Stable, Respiratory Function Stable, Patent Airway, No signs of Nausea or vomiting and Pain level controlled  Post-op Vital Signs: Reviewed and stable  Last Vitals:  Filed Vitals:   03/31/14 1245  BP:   Pulse: 59  Temp:   Resp: 11    Complications: No apparent anesthesia complications

## 2014-03-31 NOTE — Brief Op Note (Signed)
03/31/2014  11:16 AM  PATIENT:  Candace Pearson  23 y.o. female  PRE-OPERATIVE DIAGNOSIS:  Hidradanitis  POST-OPERATIVE DIAGNOSIS:  Hidradanitis  PROCEDURE:  Procedure(s): EXCISION LEFT AXILLA HIDRADENITIS  (Left) 7 cm by 3 cm Intermediate closure  7 cm   SURGEON:  Surgeon(s) and Role:    * Harriette Bouillon, MD - Primary      ANESTHESIA:   local and general  EBL:  Total I/O In: 700 [I.V.:700] Out: -   BLOOD ADMINISTERED:none  DRAINS: none   LOCAL MEDICATIONS USED:  BUPIVICAINE   SPECIMEN:  Source of Specimen:  left axilla  DISPOSITION OF SPECIMEN:  PATHOLOGY  COUNTS:  YES  TOURNIQUET:  * No tourniquets in log *  DICTATION: .Other Dictation: Dictation Number [id number]  PLAN OF CARE: Discharge to home after PACU  PATIENT DISPOSITION:  PACU - hemodynamically stable.   Delay start of Pharmacological VTE agent (>24hrs) due to surgical blood loss or risk of bleeding: not applicable

## 2014-03-31 NOTE — Discharge Instructions (Signed)
GENERAL SURGERY: POST OP INSTRUCTIONS ° °1. DIET: Follow a light bland diet the first 24 hours after arrival home, such as soup, liquids, crackers, etc.  Be sure to include lots of fluids daily.  Avoid fast food or heavy meals as your are more likely to get nauseated.   °2. Take your usually prescribed home medications unless otherwise directed. °3. PAIN CONTROL: °a. Pain is best controlled by a usual combination of three different methods TOGETHER: °i. Ice/Heat °ii. Over the counter pain medication °iii. Prescription pain medication °b. Most patients will experience some swelling and bruising around the incisions.  Ice packs or heating pads (30-60 minutes up to 6 times a day) will help. Use ice for the first few days to help decrease swelling and bruising, then switch to heat to help relax tight/sore spots and speed recovery.  Some people prefer to use ice alone, heat alone, alternating between ice & heat.  Experiment to what works for you.  Swelling and bruising can take several weeks to resolve.   °c. It is helpful to take an over-the-counter pain medication regularly for the first few weeks.  Choose one of the following that works best for you: °i. Naproxen (Aleve, etc)  Two 220mg tabs twice a day °ii. Ibuprofen (Advil, etc) Three 200mg tabs four times a day (every meal & bedtime) °iii. Acetaminophen (Tylenol, etc) 500-650mg four times a day (every meal & bedtime) °d. A  prescription for pain medication (such as oxycodone, hydrocodone, etc) should be given to you upon discharge.  Take your pain medication as prescribed.  °i. If you are having problems/concerns with the prescription medicine (does not control pain, nausea, vomiting, rash, itching, etc), please call us [phone] to see if we need to switch you to a different pain medicine that will work better for you and/or control your side effect better. °ii. If you need a refill on your pain medication, please contact your pharmacy.  They will contact our  office to request authorization. Prescriptions will not be filled after 5 pm or on week-ends. °4. Avoid getting constipated.  Between the surgery and the pain medications, it is common to experience some constipation.  Increasing fluid intake and taking a fiber supplement (such as Metamucil, Citrucel, FiberCon, MiraLax, etc) 1-2 times a day regularly will usually help prevent this problem from occurring.  A mild laxative (prune juice, Milk of Magnesia, MiraLax, etc) should be taken according to package directions if there are no bowel movements after 48 hours.   °5. Wash / shower every day.  You Stahly shower over the dressings as they are waterproof.  Continue to shower over incision(s) after the dressing is off. °6. Remove your waterproof bandages 5 days after surgery.  You Winer leave the incision open to air.  You Mcmann have skin tapes (Steri Strips) covering the incision(s).  Leave them on until one week, then remove.  You Karney replace a dressing/Band-Aid to cover the incision for comfort if you wish.  ° ° ° ° °7. ACTIVITIES as tolerated:   °a. You Stogner resume regular (light) daily activities beginning the next day--such as daily self-care, walking, climbing stairs--gradually increasing activities as tolerated.  If you can walk 30 minutes without difficulty, it is safe to try more intense activity such as jogging, treadmill, bicycling, low-impact aerobics, swimming, etc. °b. Save the most intensive and strenuous activity for last such as sit-ups, heavy lifting, contact sports, etc  Refrain from any heavy lifting or straining until you   are off narcotics for pain control.   c. DO NOT PUSH THROUGH PAIN.  Let pain be your guide: If it hurts to do something, don't do it.  Pain is your body warning you to avoid that activity for another week until the pain goes down. d. You may drive when you are no longer taking prescription pain medication, you can comfortably wear a seatbelt, and you can safely maneuver your car and  apply brakes. e. Bonita Quin may have sexual intercourse when it is comfortable.  8. FOLLOW UP in our office a. Please call [facility] at [phone] to set up an appointment to see your surgeon in the office for a follow-up appointment approximately 2-3 weeks after your surgery. b. Make sure that you call for this appointment the day you arrive home to insure a convenient appointment time. 9. IF YOU HAVE DISABILITY OR FAMILY LEAVE FORMS, BRING THEM TO THE OFFICE FOR PROCESSING.  DO NOT GIVE THEM TO YOUR DOCTOR.   WHEN TO CALL us [phone]: 1. Poor pain control 2. Reactions / problems with new medications (rash/itching, nausea, etc)  3. Fever over 101.5 F (38.5 C) 4. Worsening swelling or bruising 5. Continued bleeding from incision. 6. Increased pain, redness, or drainage from the incision 7. Difficulty breathing / swallowing   The clinic staff is available to answer your questions during regular business hours (8:30am-5pm).  Please dont hesitate to call and ask to speak to one of our nurses for clinical concerns.   If you have a medical emergency, go to the nearest emergency room or call 911.  A surgeon from [facility] is always on call at the [facility], [place] [address], [address], [place], [place]  [address] ? MAIN: [phone] ? TOLL FREE: [phone] ?  FAX [phone] [email or web address] What to eat:  For your first meals, you should eat lightly; only small meals initially.  If you do not have nausea, you may eat larger meals.  Avoid spicy, greasy and heavy food.

## 2014-03-31 NOTE — H&P (Signed)
Transplants    None    Demographics Candace Pearson 23 year old female  [address]  [place] [address] [phone] [facility])  Comm Pref: None [address]  [place] [address] [phone] ([phone] (M)   Problem ListHospitalization ProblemNon-Hospital  S/P cesarean section  Hidradenitis suppurativa of left axilla  Significant History/Details  Smoking: Former Smoker (Quit Date:03/15/2013), .1 ppd, .8 pack-years  Smokeless Tobacco: Never Used  Alcohol: No  Comments: labels pfm  2 open orders  Preferred Language: English  Dialysis HistoryNone   Currently admitted as of 9/17/2015Specialty CommentsEditShow AllReport8/10/15 pt signed phi for significant other, sean aiken, aunt-onita outlaw & mother, Simeon Craft - bbk  03/04/14 for the month of August patient is showing as a regular medicaid no precert required ip/op. cpt code 27253 (chm)  DOS 03/31/14 [facility]-OP Exc Left Axilla Hidradanitis/kas 03/04/14 11450 03-08-14 pt op sx schd 03-31-14 @ [facility] no precert required per [place] Tr[employer] check benefits 03-15-14. (chm,kas,tvp)   MedicationsHospital Medications Outpatient Medications  ceFAZolin (ANCEF) IVPB 2 g/50 mL premix  diphenhydrAMINE (BENADRYL) injection 10 mg  lactated ringers infusion    Preferred Labs   None   Transplant-Related Biopsies (11 years) ** None **  Patient Blood Type (50 years)  ABO Grouping Rh Factor  06/02/13 0000 B --                                 Recent Visits (Maximum of 10 visits)Date Type Provider Description  03/31/2014 Surgery Aroush Chasse A., MD   02/21/2014 Office Visit Eluterio Seymour A., MD Hidradenitis Suppurativa of Left Axilla (Primary Dx)         My Last Outpatient Progress NoteStatus Last Edited Encounter Date  Signed Mon Feb 21, 2014 4:06 PM EDT 02/21/2014  Patient ID: Candace Pearson, female   DOB: [birth year], 23 y.o.   MRN: [id number]    Chief Complaint   Patient presents with   .   bilateral hydradenitis axila      HPI Candace Pearson is a 23 y.o. female.   HPI Patient sent at the request of Jac Canavan, PA-C   For bilateral axillary hidradenitis. Patient has had this condition for many years but has been exacerbated by recent pregnancy. She is open areas that are tender and drain in both axilla. She's had multiple incision and drainage procedures and antibiotics. She is searching for a more definitive treatment of his condition. Currently, her right axilla is doing well with no evidence of flare. Her left axilla draining and causing mild to moderate discomfort. Denies fever or chills. Past Medical History   Diagnosis  Date   .  Wears glasses     .  Hidradenitis     .  Termination of pregnancy  2010, 2013       x 2   .  Heartburn in pregnancy     .  Migraine         otc med prn       Past Surgical History   Procedure  Laterality  Date   .  Cesarean section  N/A  12/28/2013       Procedure: CESAREAN SECTION;  Surgeon: Michael Litter, MD;  Location: [facility];  Service: Obstetrics; Laterality: N/A;       Family History   Problem  Relation  Age of Onset   .  Cancer  Neg Hx     .  Heart disease  Neg Hx     .  Diabetes  Neg Hx     .  Stroke  Neg Hx     .  Hypertension  Neg Hx        Social History History   Substance Use Topics   .  Smoking status:  Former Smoker -- 0.10 packs/day for 8 years       Types:  Cigarettes       Quit date:  03/15/2013   .  Smokeless tobacco:  Never Used   .  Alcohol Use:  No         Comment: marijuana in the past       Allergies   Allergen  Reactions   .  Monosodium Glutamate  Hives and Swelling       Lips swell       Current Outpatient Prescriptions   Medication  Sig  Dispense  Refill   .  ibuprofen (ADVIL,MOTRIN) 600 MG tablet  Take 1 tablet (600 mg total) by mouth every 6 (six) hours.   30 tablet   0   .  cephALEXin (KEFLEX) 500 MG capsule  Take 1 capsule (500 mg total) by mouth 4 (four) times daily.   40 capsule    0       No current facility-administered medications for this visit.      Review of Systems Review of Systems  Constitutional: Negative for fever, chills and unexpected weight change.  HENT: Negative for congestion, hearing loss, sore throat, trouble swallowing and voice change.   Eyes: Negative for visual disturbance.  Respiratory: Negative for cough and wheezing.   Cardiovascular: Negative for chest pain, palpitations and leg swelling.  Gastrointestinal: Negative for nausea, vomiting, abdominal pain, diarrhea, constipation, blood in stool, abdominal distention and anal bleeding.  Genitourinary: Negative for hematuria, vaginal bleeding and difficulty urinating.  Musculoskeletal: Negative for arthralgias.  Skin: Negative for rash and wound.  Neurological: Negative for seizures, syncope and headaches.  Hematological: Negative for adenopathy. Does not bruise/bleed easily.  Psychiatric/Behavioral: Negative for confusion.    Blood pressure 116/66, pulse 82, temperature 98.5 F (36.9 C), temperature source Oral, height  (1.651 m), weight 161 lb (73.029 kg), SpO2 98.00%, currently breastfeeding.   Physical Exam Physical Exam  Constitutional: She is oriented to person, place, and time. She appears well-developed and well-nourished.  HENT:   Head: Normocephalic.   Mouth/Throat: No oropharyngeal exudate.  Eyes: Pupils are equal, round, and reactive to light. Scleral icterus is present.  Neck: Normal range of motion.  Cardiovascular: Normal rate.   Pulmonary/Chest: Effort normal and breath sounds normal.  Neurological: She is alert and oriented to person, place, and time.  Skin:     Psychiatric: She has a normal mood and affect. Her behavior is normal. Thought content normal.    Data Reviewed none   Assessment Hidradenitis left axilla 6 cm x 3 cm without active infection   Plan Recommendations excision  left axilla hidradenitis. Risks, benefits and alternatives  discussed. Risk of bleeding, infection, stricture, pain, discomfort, recurrence, and any further operations discussed.The procedure has been discussed with the patient.  Alternative therapies have been discussed with the patient.  Operative risks include bleeding,  Infection,  Organ injury,  Nerve injury,  Blood vessel injury,  DVT,  Pulmonary embolism,  Death,  And possible reoperation.  Medical management risks include worsening of present situation.  The success of  the procedure is 50 -90 % at treating patients symptoms.  The patient understands and agrees to proceed. Place on keflex 500 mg QID for two weeks prior to surgery.        Kayle Passarelli A.

## 2014-03-31 NOTE — Interval H&P Note (Signed)
History and Physical Interval Note:  03/31/2014 9:48 AM  Candace Pearson  has presented today for surgery, with the diagnosis of Hidradanitis  The various methods of treatment have been discussed with the patient and family. After consideration of risks, benefits and other options for treatment, the patient has consented to  Procedure(s): EXCISION LEFT AXILLA HIDRADENITIS  (Left) as a surgical intervention .  The patient's history has been reviewed, patient examined, no change in status, stable for surgery.  I have reviewed the patient's chart and labs.  Questions were answered to the patient's satisfaction.     [name]

## 2014-03-31 NOTE — Transfer of Care (Signed)
Immediate Anesthesia Transfer of Care Note  Patient: Candace Pearson  Procedure(s) Performed: Procedure(s): EXCISION LEFT AXILLA HIDRADENITIS  (Left)  Patient Location: PACU  Anesthesia Type:General  Level of Consciousness: awake  Airway & Oxygen Therapy: Patient Spontanous Breathing and Patient connected to nasal cannula oxygen  Post-op Assessment: Report given to PACU RN  Post vital signs: Reviewed and stable  Complications: No apparent anesthesia complications

## 2014-03-31 NOTE — Anesthesia Preprocedure Evaluation (Addendum)
Anesthesia Evaluation  Patient identified by MRN, date of birth, ID band Patient awake    Reviewed: Allergy & Precautions, H&P , NPO status , Patient's Chart, lab work & pertinent test results  History of Anesthesia Complications Negative for: history of anesthetic complications  Airway Mallampati: I TM Distance: >3 FB Neck ROM: Full    Dental  (+) Teeth Intact, Dental Advisory Given   Pulmonary former smoker (quit 9/14),  breath sounds clear to auscultation        Cardiovascular negative cardio ROS  Rhythm:Regular Rate:Normal     Neuro/Psych  Headaches,    GI/Hepatic negative GI ROS, Neg liver ROS,   Endo/Other  negative endocrine ROS  Renal/GU negative Renal ROS     Musculoskeletal   Abdominal   Peds  Hematology negative hematology ROS (+)   Anesthesia Other Findings   Reproductive/Obstetrics 03/29/14 preg test: NEG                          Anesthesia Physical Anesthesia Plan  ASA: II  Anesthesia Plan: General   Post-op Pain Management:    Induction: Intravenous  Airway Management Planned: LMA  Additional Equipment:   Intra-op Plan:   Post-operative Plan:   Informed Consent: I have reviewed the patients History and Physical, chart, labs and discussed the procedure including the risks, benefits and alternatives for the proposed anesthesia with the patient or authorized representative who has indicated his/her understanding and acceptance.   Dental advisory given  Plan Discussed with: CRNA and Surgeon  Anesthesia Plan Comments: (Plan routine monitors, GA- LMA OK)        Anesthesia Quick Evaluation

## 2014-03-31 NOTE — Anesthesia Procedure Notes (Signed)
Procedure Name: LMA Insertion Date/Time: 03/31/2014 10:37 AM Performed by: Delia Chimes E Pre-anesthesia Checklist: Patient identified, Timeout performed, Emergency Drugs available, Suction available and Patient being monitored Patient Re-evaluated:Patient Re-evaluated prior to inductionOxygen Delivery Method: Circle system utilized Preoxygenation: Pre-oxygenation with 100% oxygen Intubation Type: IV induction LMA: LMA inserted LMA Size: 4.0 Number of attempts: 1 Placement Confirmation: positive ETCO2 and breath sounds checked- equal and bilateral Tube secured with: Tape Dental Injury: Teeth and Oropharynx as per pre-operative assessment  Comments: Dr. Rosalene Billings confirmed B/L breath sounds

## 2014-04-01 NOTE — Op Note (Signed)
Candace Pearson, Candace Pearson NO.:  [id number]  MEDICAL RECORD NO.:  [id number]  LOCATION:  [facility]                         FACILITY:  [facility]  PHYSICIAN:  Maisie Fus A. Brailon Don, Candace PearsonDATE OF BIRTH:  23-Feb-1991  DATE OF PROCEDURE:  03/31/2014 DATE OF DISCHARGE:                              OPERATIVE REPORT   PREOPERATIVE DIAGNOSIS:  A 7 cm x 3 cm region of left axillary hidradenitis suppurativa.  POSTOPERATIVE DIAGNOSIS:  A 7 cm x 3 cm region of left axillary hidradenitis suppurativa.  PROCEDURE: 1. Excision of left axillary hidradenitis suppurativa, measured 7 x 3     cm. 2. Intermediate closure of wound 7 cm.  SURGEON:  Maisie Fus A. Sterlin Knightly, M.D.  ANESTHESIA:  LMA with 0.25% Sensorcaine local.  ESTIMATED BLOOD LOSS:  Minimal.  SPECIMEN:  Skin from left axilla to Pathology.  DRAINS:  None.  INDICATIONS FOR PROCEDURE:  The patient is a 23 year old female, who has chronic problems with hidradenitis.  Her left axilla was the primary spot, where she has trouble.  We tried medical management which she has failed.  She would like to proceed with excision.  Risks, benefits, and alternatives to surgery were discussed.  She presents today for wide excision of this area in her left axilla.  Complications discussed.  Poor wound healing, recurrence, organ injury, nerve injury, blood vessel injury, recurrence, the need for other procedures discussed.  DESCRIPTION OF PROCEDURE:  The patient was seen in the holding area. Left axilla was marked.  Questions were answered.  She was taken back to the operating room and placed supine on the operating room table.  Time- out was done.  Left side was verified as correct and the patient identification was done.  The left axilla was prepped and draped in sterile fashion.  Time-out was done.  Curvilinear incision was made around the area measuring 7 x 3 cm in left axilla.  This was a second area just on the arm itself which measured about a  centimeter, excised as well.  The areas were excised in their entirety and passed off the field.  Wound was made hemostatic by cautery.  Superior and inferior skin flaps were raised for 2 cm along the entire 7 cm incision.  Deep layer was then used of 0 Vicryl to close the deep layers of tissues down.  A 4-0 Monocryl was used to close both skin incisions with a subcuticular stitch. Dermabond was applied to both.  All final counts of sponge, needle, and instruments found to be correct at this portion of the case.  The wound was then dressed with a sterile dressing.  The patient was then extubated, taken to recovery in satisfactory condition.  All final counts were found to be correct.     Candace Pearson, M.D.     TAC/MEDQ  D:  03/31/2014  T:  03/31/2014  Job:  [id number]

## 2014-04-04 ENCOUNTER — Encounter (HOSPITAL_COMMUNITY): Payer: Self-pay | Admitting: Surgery

## 2014-05-16 ENCOUNTER — Encounter (HOSPITAL_COMMUNITY): Payer: Self-pay | Admitting: Surgery

## 2014-06-08 ENCOUNTER — Encounter: Payer: Self-pay | Admitting: Internal Medicine

## 2014-07-11 ENCOUNTER — Ambulatory Visit (INDEPENDENT_AMBULATORY_CARE_PROVIDER_SITE_OTHER): Payer: Medicaid Other | Admitting: Medical

## 2014-07-11 ENCOUNTER — Encounter: Payer: Self-pay | Admitting: Medical

## 2014-07-11 VITALS — BP 100/70 | HR 72 | Temp 97.7°F | Resp 16 | Wt 183.0 lb

## 2014-07-11 DIAGNOSIS — S83412A Sprain of medial collateral ligament of left knee, initial encounter: Secondary | ICD-10-CM

## 2014-07-11 DIAGNOSIS — S8992XA Unspecified injury of left lower leg, initial encounter: Secondary | ICD-10-CM

## 2014-07-11 MED ORDER — NAPROXEN 375 MG PO TABS: 375.0000 mg | ORAL_TABLET | Freq: Two times a day (BID) | ORAL | Status: DC

## 2014-07-11 NOTE — Progress Notes (Signed)
Subjective: Here for left knee injury.   2 weeks ago was playing around on the stairs in her house with her husband.  Risa GrillJokingly he pulled her right leg downward (she was coming down the stairs), and she fell down the stairs with left let bending diagonally backwards.   Since then knee has been swollen and painful.  Tried some cold compresses.  extending the lower leg or bending back the knee hurts a lot.  No prior knee issues.  No prior leg surgery.   Using some ibuprofen.  No other aggravating or relieving factors. No other complaint.  ROS as in subjective  Objective: Gen: wd, wn, nad Skin: no warmth, erythema, ecchymosis MSK: tender over left MCL, no obvious swelling, mild pain with knee flexion and with Mcmurray's test but no clicking or grinding.  otherwise nontender, no laxity. Leg neurovascularly intact    Assessment: Encounter Diagnoses  Name Primary?  . Knee injuries, left, initial encounter Yes  . Medial collateral ligament sprain of knee, left, initial encounter     Plan: Script for naprosyn, crutches.   Discussed findings, treatment, f/u.  Patient Instructions   Encounter Diagnoses  Name Primary?  . Knee injuries, left, initial encounter Yes  . Medial collateral ligament sprain of knee, left, initial encounter      I suspect you have strained the left medial collateral ligament  For the next 1-2 weeks,   Ice the knee daily  Use a compressive knee sleeve daily, but not at bedtime  Begin Naprosyn for pain and inflammation, oral tablets twice daily  Use gentle stretching of the knee  Rest the knee/stay off of it as much as possible  If not much improved in 1-2 weeks, we may need to recheck and consider imaging

## 2014-07-11 NOTE — Patient Instructions (Addendum)
Encounter Diagnoses  Name Primary?  . Knee injuries, left, initial encounter Yes  . Medial collateral ligament sprain of knee, left, initial encounter      I suspect you have strained the left medial collateral ligament  For the next 1-2 weeks,   Ice the knee daily  Use a compressive knee sleeve daily, but not at bedtime  Begin Naprosyn for pain and inflammation, oral tablets twice daily  Use gentle stretching of the knee  Rest the knee/stay off of it as much as possible  If not much improved in 1-2 weeks, we may need to recheck and consider imaging

## 2014-11-27 IMAGING — US US PELVIS COMPLETE
1 series · 13 of 25 positions shown · non-contrast
Comparison: None

CLINICAL DATA: Three weeks postpartum. Status post C-section
delivery. Increasing pelvic pain. Question of retained products.

EXAM:
TRANSABDOMINAL AND TRANSVAGINAL ULTRASOUND OF PELVIS
TECHNIQUE: Both transabdominal and transvaginal ultrasound examinations of the
pelvis were performed. Transabdominal technique was performed for
global imaging of the pelvis including uterus, ovaries, adnexal
regions, and pelvic cul-de-sac. It was necessary to proceed with
endovaginal exam following the transabdominal exam to visualize the
endometrium and ovaries.

[Series 1: us pelvis complete · 0.20mm/px · 13 of 64 slices shown]
[im 1/64]
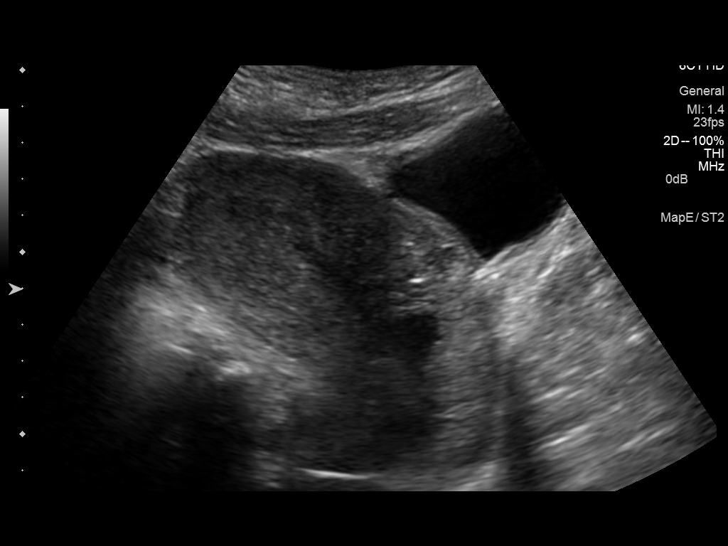
[im 6/64]
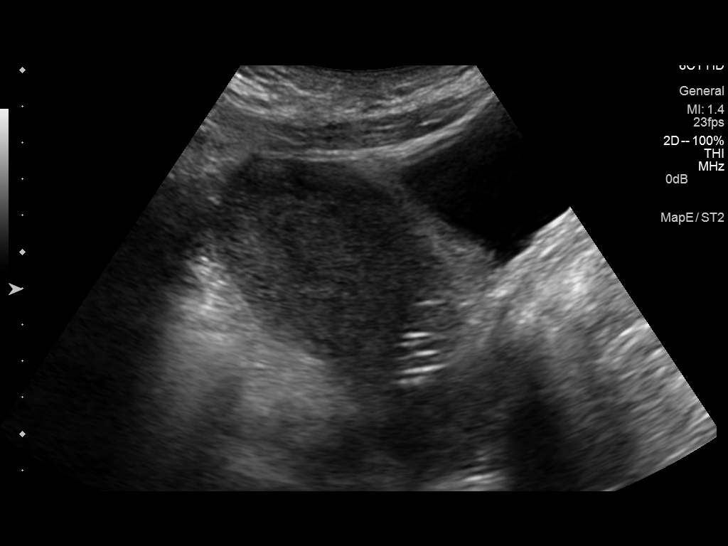
[im 11/64]
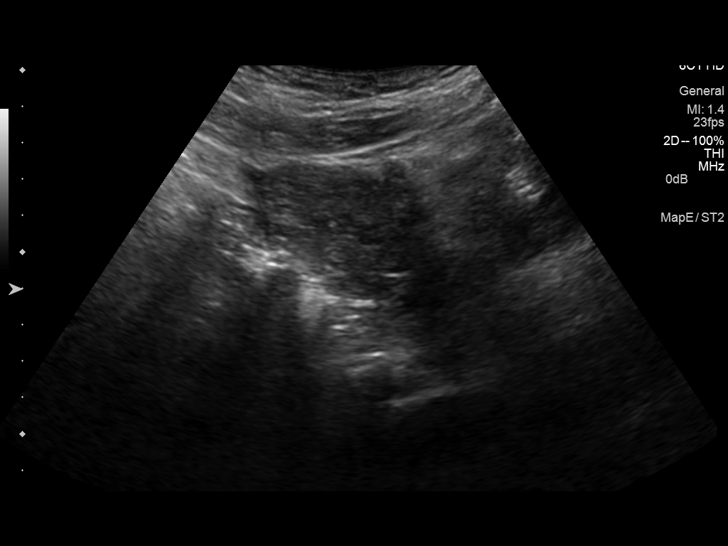
[im 16/64]
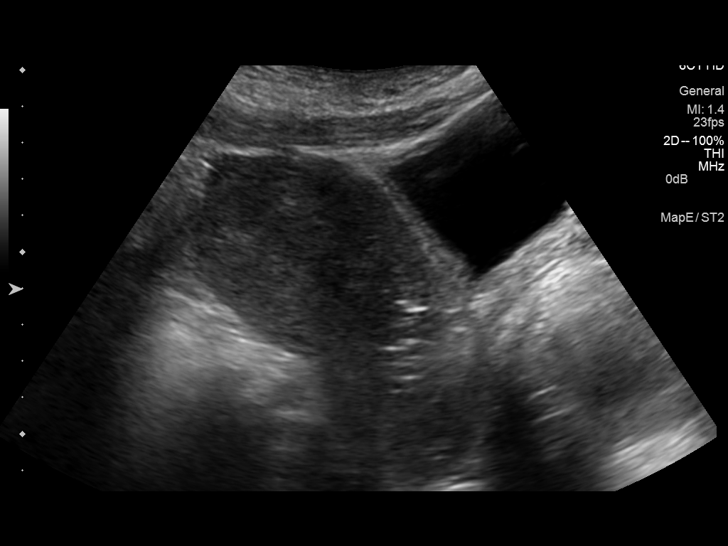
[im 22/64]
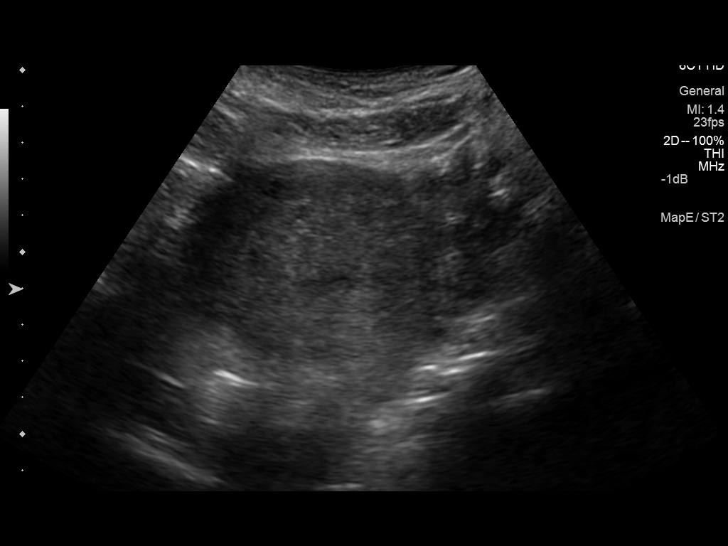
[im 27/64]
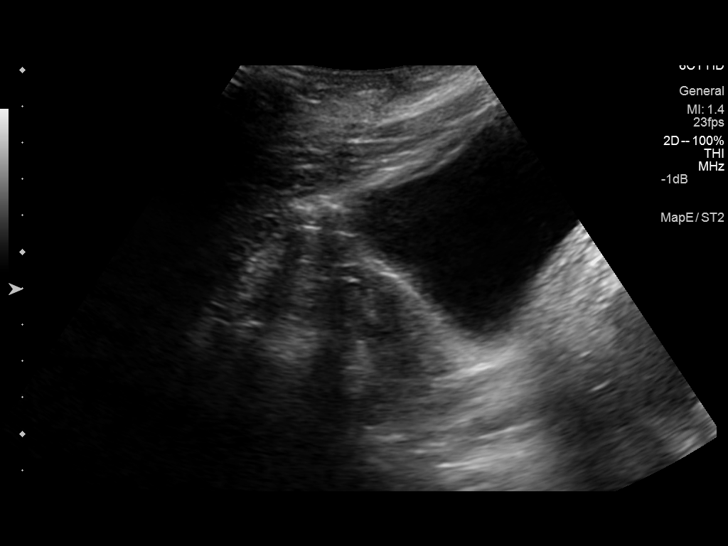
[im 32/64]
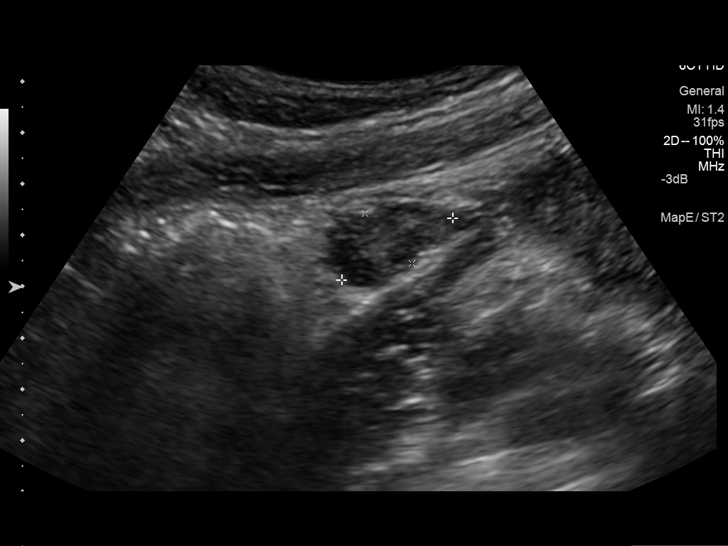
[im 37/64]
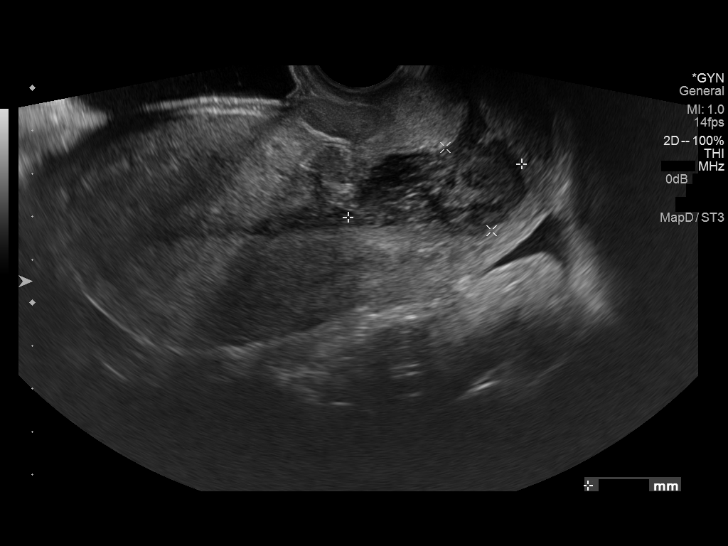
[im 43/64]
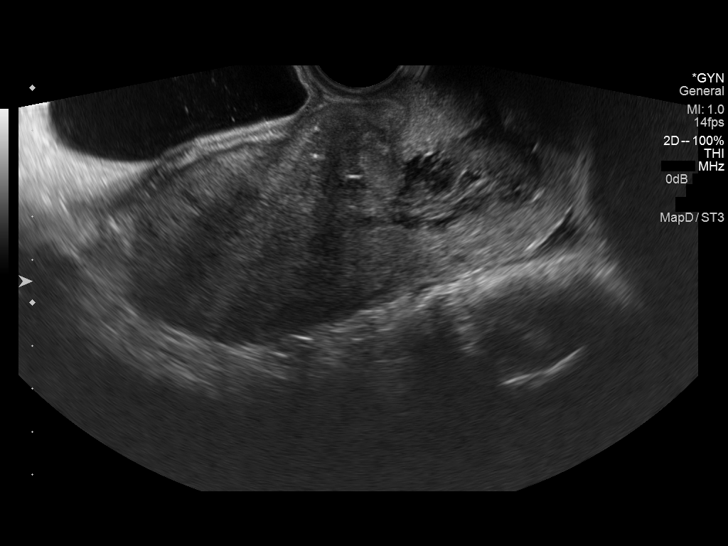
[im 48/64]
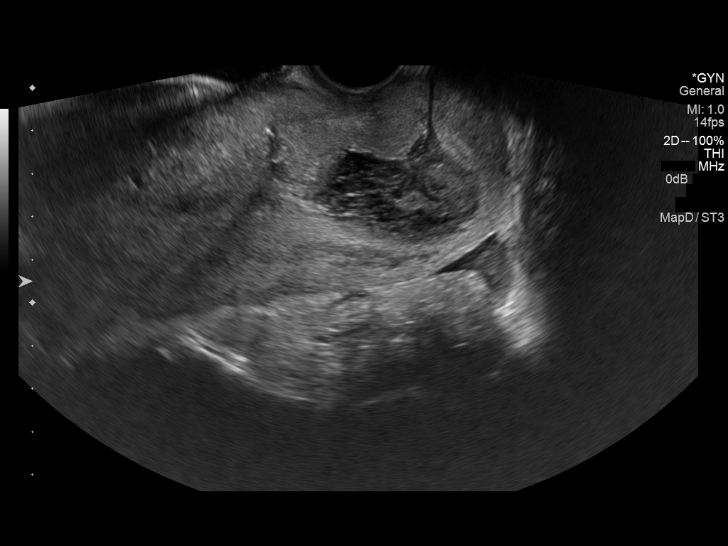
[im 53/64]
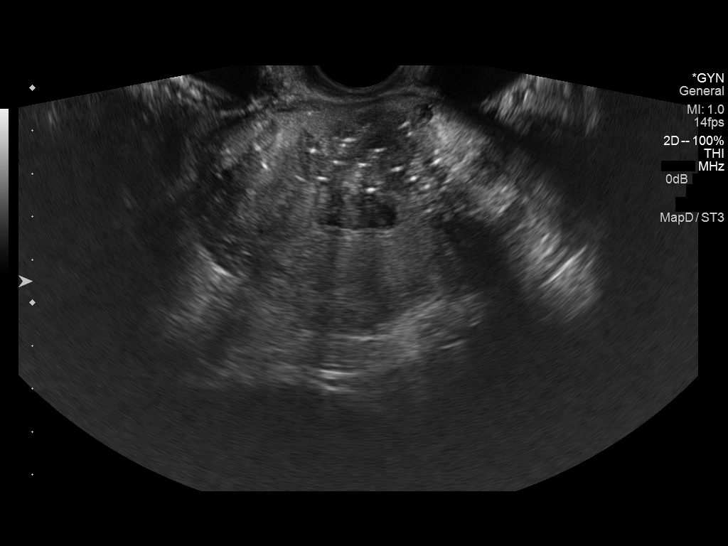
[im 58/64]
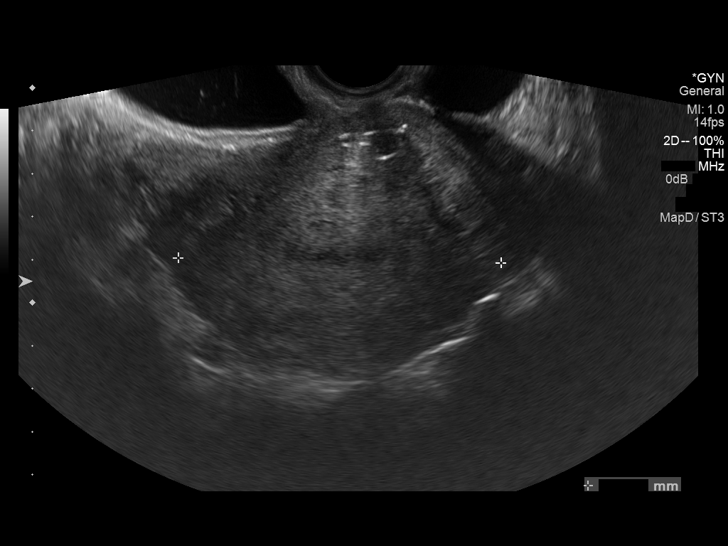
[im 64/64]
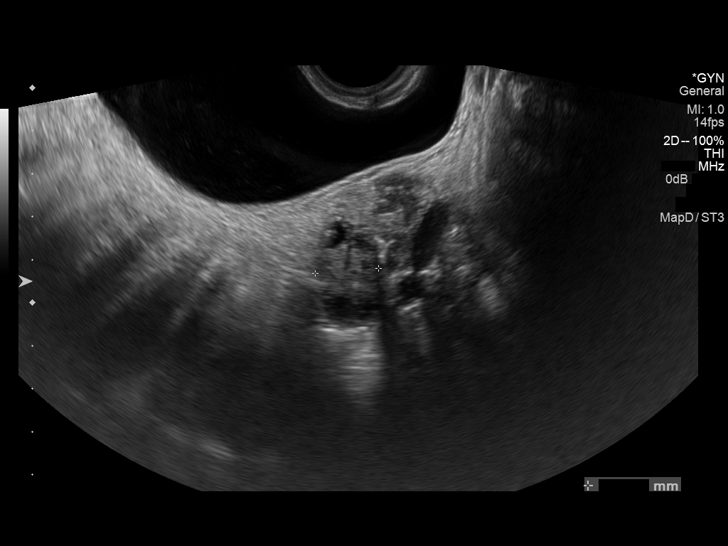

[13 of 25 positions shown; findings below may reference images not displayed]

FINDINGS: Uterus

Measurements: 11.8 x 5.4 x 7.5 cm. The endometrial canal appears
normal. There is a complex masslike area in the endocervical canal
which is most likely a hematoma. Retained products would be unusual
after C-section. No vascularity is demonstrated. A C-section scar is
noted.

Endometrium

Thickness: Normal thickness. Small amount of endometrial fluid.. No
focal abnormality visualized.

Right ovary

Measurements: 3.3 x 2.3 x 1.8 cm. Normal appearance/no adnexal mass.

Left ovary

Measurements: 3.0 x 2.3 x 1.2 cm. Normal appearance/no adnexal mass.

Other findings

Small amount of free pelvic fluid.
IMPRESSION: 1. 4.2 x 2.2 x 3.9 cm irregular soft tissue "mass" in the
endocervical canal. This is most likely a retained hematoma.
2. Small amount of fluid in the endometrium but no retained
products.
3. Normal ovaries.
4. Small amount of free pelvic fluid.

## 2015-02-20 ENCOUNTER — Encounter: Payer: Self-pay | Admitting: Medical

## 2015-02-20 ENCOUNTER — Ambulatory Visit (INDEPENDENT_AMBULATORY_CARE_PROVIDER_SITE_OTHER): Payer: Medicaid Other | Admitting: Medical

## 2015-02-20 VITALS — BP 108/76 | HR 76 | Temp 98.9°F | Resp 15 | Ht 65.5 in | Wt 195.2 lb

## 2015-02-20 DIAGNOSIS — L732 Hidradenitis suppurativa: Secondary | ICD-10-CM

## 2015-02-20 MED ORDER — DOXYCYCLINE HYCLATE 100 MG PO TABS: 100.0000 mg | ORAL_TABLET | Freq: Two times a day (BID) | ORAL | Status: DC

## 2015-02-20 MED ORDER — NAPROXEN 500 MG PO TABS: 500.0000 mg | ORAL_TABLET | Freq: Two times a day (BID) | ORAL | Status: DC

## 2015-02-20 MED ORDER — CLINDAMYCIN PHOSPHATE 1 % EX GEL: Freq: Two times a day (BID) | CUTANEOUS | Status: DC

## 2015-02-20 NOTE — Progress Notes (Signed)
Subjective Chief Complaint  Patient presents with  . Acute Visit    right arm pit bump, painful, has been going on for 2 weeks.  Had sweat gland removed from left arm for the same problem.  Contacted same surgeon, says she needs another referral.   Here for 2 week hx/o swollen painful skin lesions right axilla.  After I saw her a year ago for similar, we referred to general surgery, and they cut out the affected tissue on the left.   She has long hx/o hidradenitis, lesions don't ever go away.   Has had I&D prior before last year's surgery  Current one very painful in right axilla. unfortunately she starts school again in 2wk, can't go for surgery on the right currently.   No fever, no NVD, no other skin concern.   No other aggravating or relieving factors. No other complaint.  ROS as in subjective  Objective: BP 108/76 mmHg  Pulse 76  Temp(Src) 98.9 F (37.2 C) (Oral)  Resp 15  Ht 5' 5.5" (1.664 m)  Wt 195 lb 3.2 oz (88.542 kg)  BMI 31.98 kg/m2  Gen: wd, wn, nad Right axilla with tender swelling superficial lumps that seem to connect, possibly 2cm x 4cm area of concern c/w hidradenitis suppurative, no fluctuance, no warmth, but there is induration, pink/red coloration   Assessment: Encounter Diagnosis  Name Primary?  . Hidradenitis suppurativa of right axilla Yes    Plan: discussed hygiene, begin oral Doxycycline and topical Clindagel.  Will c/t clindagel for at least a month.   Naprosyn for pain.  If not much improved in 2-3 wk, will need to consider daily prophylactic antibiotic either topical or oral such as Doxy or Keflex.  F/u 2-3 wk.   Candace Pearson was seen today for acute visit.  Diagnoses and all orders for this visit:  Hidradenitis suppurativa of right axilla  Other orders -     doxycycline (VIBRA-TABS) 100 MG tablet; Take 1 tablet (100 mg total) by mouth 2 (two) times daily. -     clindamycin (CLINDAGEL) 1 % gel; Apply topically 2 (two) times daily. -     naproxen  (NAPROSYN) 500 MG tablet; Take 1 tablet (500 mg total) by mouth 2 (two) times daily with a meal.

## 2015-03-03 ENCOUNTER — Ambulatory Visit: Payer: Medicaid Other | Admitting: Medical

## 2015-08-25 ENCOUNTER — Telehealth: Payer: Self-pay | Admitting: Medical

## 2015-08-25 ENCOUNTER — Ambulatory Visit (INDEPENDENT_AMBULATORY_CARE_PROVIDER_SITE_OTHER): Payer: Medicaid Other | Admitting: Medical

## 2015-08-25 ENCOUNTER — Encounter: Payer: Self-pay | Admitting: Medical

## 2015-08-25 VITALS — BP 110/78 | HR 80 | Wt 196.0 lb

## 2015-08-25 DIAGNOSIS — L732 Hidradenitis suppurativa: Secondary | ICD-10-CM

## 2015-08-25 MED ORDER — DOXYCYCLINE HYCLATE 100 MG PO TABS: 100.0000 mg | ORAL_TABLET | Freq: Two times a day (BID) | ORAL | Status: DC

## 2015-08-25 MED ORDER — CLINDAMYCIN PHOS-BENZOYL PEROX 1-5 % EX GEL: Freq: Two times a day (BID) | CUTANEOUS | Status: DC

## 2015-08-25 NOTE — Telephone Encounter (Signed)
err

## 2015-08-25 NOTE — Progress Notes (Signed)
Subjective: Chief Complaint  Patient presents with  . lump under rt arm pit    appeared 2 weeks ago. painful. thinks it is growing and has drainage. gets frequently.   Here for right armpit lesion.  She has hx/o hidradenitis sup pura with general surgery visit this past year for left recurrent boils.  She has had no problems with the left since.   She was here in August for flare on the right. She now has another flare starting on the right.   Just started a few days ago with tender lump in right armpit.  Denies fever, nausea, vomiting, no breast concerns.  Using left over clindamycin gel from 02/2015.    No other aggravating or relieving factors. No other complaint.  ROS as in subjective   Objective: BP 110/78 mmHg  Pulse 80  Wt 196 lb (88.905 kg)  LMP 08/18/2015  Breastfeeding? No  Gen: wd,wn,nad Skin: right axilla with 2cm x 4 cm indurated tender area without fluctuance or erythema, no drainage.    Assessment Encounter Diagnosis  Name Primary?  . Hidradenitis axillaris Yes    Plan: Begin warm compresses, medications below, and if not resolving within a week or worse to recheck.   Return soon for physical.  Of note, she will need a physical for entry into [employer] soon.  Matia was seen today for lump under rt arm pit.  Diagnoses and all orders for this visit:  Hidradenitis axillaris  Other orders -     doxycycline (VIBRA-TABS) 100 MG tablet; Take 1 tablet (100 mg total) by mouth 2 (two) times daily. -     clindamycin-benzoyl peroxide (BENZACLIN) gel; Apply topically 2 (two) times daily.

## 2015-09-10 ENCOUNTER — Emergency Department (HOSPITAL_COMMUNITY)
Admission: EM | Admit: 2015-09-10 | Discharge: 2015-09-10 | Disposition: A | Discharge status: Home / Self Care | Payer: Medicaid Other | Attending: Emergency Medicine | Admitting: Emergency Medicine

## 2015-09-10 ENCOUNTER — Emergency Department (HOSPITAL_COMMUNITY): Payer: Medicaid Other

## 2015-09-10 ENCOUNTER — Encounter (HOSPITAL_COMMUNITY): Payer: Self-pay | Admitting: Emergency Medicine

## 2015-09-10 DIAGNOSIS — Z87891 Personal history of nicotine dependence: Secondary | ICD-10-CM | POA: Insufficient documentation

## 2015-09-10 DIAGNOSIS — Z8679 Personal history of other diseases of the circulatory system: Secondary | ICD-10-CM | POA: Diagnosis not present

## 2015-09-10 DIAGNOSIS — J111 Influenza due to unidentified influenza virus with other respiratory manifestations: Secondary | ICD-10-CM | POA: Diagnosis not present

## 2015-09-10 DIAGNOSIS — Z79899 Other long term (current) drug therapy: Secondary | ICD-10-CM | POA: Insufficient documentation

## 2015-09-10 DIAGNOSIS — R05 Cough: Secondary | ICD-10-CM | POA: Diagnosis present

## 2015-09-10 MED ORDER — ONDANSETRON 4 MG PO TBDP: ORAL_TABLET | ORAL | Status: DC

## 2015-09-10 MED ORDER — SODIUM CHLORIDE 0.9 % IV BOLUS (SEPSIS): 1000.0000 mL | Freq: Once | INTRAVENOUS | Status: DC

## 2015-09-10 MED ORDER — ONDANSETRON HCL 4 MG/2ML IJ SOLN
4.0000 mg | Freq: Once | INTRAMUSCULAR | Status: DC
  Filled 2015-09-10: qty 2

## 2015-09-10 MED ORDER — IBUPROFEN 800 MG PO TABS
800.0000 mg | ORAL_TABLET | Freq: Once | ORAL | Status: AC
  Administered 2015-09-10: 800 mg via ORAL
  Filled 2015-09-10: qty 1

## 2015-09-10 MED ORDER — KETOROLAC TROMETHAMINE 30 MG/ML IJ SOLN
30.0000 mg | Freq: Once | INTRAMUSCULAR | Status: DC
  Filled 2015-09-10: qty 1

## 2015-09-10 MED ORDER — ONDANSETRON 4 MG PO TBDP
4.0000 mg | ORAL_TABLET | Freq: Once | ORAL | Status: AC
  Administered 2015-09-10: 4 mg via ORAL
  Filled 2015-09-10: qty 1

## 2015-09-10 MED ORDER — OSELTAMIVIR PHOSPHATE 75 MG PO CAPS: 75.0000 mg | ORAL_CAPSULE | Freq: Two times a day (BID) | ORAL | Status: DC

## 2015-09-10 MED ORDER — IBUPROFEN 800 MG PO TABS
800.0000 mg | ORAL_TABLET | Freq: Three times a day (TID) | ORAL | Status: DC | PRN
Start: 2015-09-10 — End: 2015-10-20

## 2015-09-10 NOTE — ED Notes (Signed)
Pt from home with c/o being hot and cold, body aches, nausea, and cough x 2 days.  Pt has not taken temp at home.  Denies V/D.  NAD, A&O.

## 2015-09-10 NOTE — ED Notes (Signed)
Patient transported to X-ray 

## 2015-09-10 NOTE — ED Provider Notes (Signed)
CSN: [id number]     Arrival date & time 09/10/15  2130 History   First MD Initiated Contact with Patient 09/10/15 [phone]     Chief Complaint  Patient presents with  . flu like symptoms      (Consider location/radiation/quality/duration/timing/severity/associated sxs/prior Treatment) Patient is a 25 y.o. female presenting with cough. The history is provided by the patient (Patient complains of cough aches nausea for 24 hours. She did not get a flu shot this year).  Cough Cough characteristics:  Non-productive Severity:  Moderate Onset quality:  Sudden Timing:  Constant Progression:  Waxing and waning Chronicity:  New Smoker: no   Context: not animal exposure   Associated symptoms: no chest pain, no eye discharge, no headaches and no rash     Past Medical History  Diagnosis Date  . Wears glasses   . Hidradenitis   . Termination of pregnancy 2010, 2013    x 2  . Heartburn in pregnancy   . Migraine     otc med prn   Past Surgical History  Procedure Laterality Date  . Cesarean section N/A 12/28/2013    Procedure: CESAREAN SECTION;  Surgeon: Michael Litter, MD;  Location: [facility];  Service: Obstetrics;  Laterality: N/A;  . Hydradenitis excision Left 03/31/2014    Procedure: EXCISION LEFT AXILLA HIDRADENITIS ;  Surgeon: Harriette Bouillon, MD;  Location: [facility];  Service: General;  Laterality: Left;   Family History  Problem Relation Age of Onset  . Cancer Neg Hx   . Heart disease Neg Hx   . Diabetes Neg Hx   . Stroke Neg Hx   . Hypertension Neg Hx    Social History  Substance Use Topics  . Smoking status: Former Smoker -- 0.10 packs/day for 8 years    Types: Cigarettes    Quit date: 03/15/2013  . Smokeless tobacco: Never Used  . Alcohol Use: No     Comment: marijuana in the past   OB History    Gravida Para Term Preterm AB TAB SAB Ectopic Multiple Living   0 2 2 0 0 0 1     Review of Systems  Constitutional: Negative for appetite change and fatigue.  HENT:  Negative for congestion, ear discharge and sinus pressure.   Eyes: Negative for discharge.  Respiratory: Positive for cough.   Cardiovascular: Negative for chest pain.  Gastrointestinal: Negative for abdominal pain and diarrhea.  Genitourinary: Negative for frequency and hematuria.  Musculoskeletal: Negative for back pain.  Skin: Negative for rash.  Neurological: Negative for seizures and headaches.  Psychiatric/Behavioral: Negative for hallucinations.      Allergies  Monosodium glutamate  Home Medications   Prior to Admission medications   Medication Sig Start Date End Date Taking? Authorizing Provider  acetaminophen (TYLENOL) 500 MG tablet Take 500 mg by mouth daily as needed for headache (pain). Reported on 08/25/2015   Yes Historical Provider, MD  clindamycin-benzoyl peroxide (BENZACLIN) gel Apply topically 2 (two) times daily. 08/25/15  Yes Kermit Balo Tysinger, PA-C  Prenatal Vit-Fe Fumarate-FA (PRENATAL PO) Take 1 tablet by mouth daily. Reported on 08/25/2015   Yes Historical Provider, MD  Pseudoeph-Doxylamine-DM-APAP (NYQUIL PO) Take 1 Dose by mouth every 6 (six) hours as needed (cold symptoms).   Yes Historical Provider, MD  pseudoephedrine-acetaminophen (TYLENOL SINUS) 30-500 MG TABS tablet Take 1 tablet by mouth every 4 (four) hours as needed.   Yes Historical Provider, MD  ibuprofen (ADVIL,MOTRIN) 800 MG tablet Take 1 tablet (800 mg total)  by mouth every 8 (eight) hours as needed for fever or moderate pain. 09/10/15   Bethann Berkshire, MD  ondansetron (ZOFRAN ODT) 4 MG disintegrating tablet  ODT q4 hours prn nausea/vomit 09/10/15   Bethann Berkshire, MD  oseltamivir (TAMIFLU) 75 MG capsule Take 1 capsule (75 mg total) by mouth every 12 (twelve) hours. 09/10/15   Bethann Berkshire, MD   BP 108/64 mmHg  Pulse 96  Temp(Src) 99.7 F (37.6 C) (Oral)  Resp 17  Ht 5' 5.5" (1.664 m)  Wt 196 lb (88.905 kg)  BMI 32.11 kg/m2  SpO2 96%  LMP 08/18/2015 (Exact Date) Physical Exam   Constitutional: She is oriented to person, place, and time. She appears well-developed.  HENT:  Head: Normocephalic.  Eyes: Conjunctivae and EOM are normal. No scleral icterus.  Neck: Neck supple. No thyromegaly present.  Cardiovascular: Normal rate and regular rhythm.  Exam reveals no gallop and no friction rub.   No murmur heard. Pulmonary/Chest: No stridor. She has no wheezes. She has no rales. She exhibits no tenderness.  Abdominal: She exhibits no distension. There is no tenderness. There is no rebound.  Musculoskeletal: Normal range of motion. She exhibits no edema.  Lymphadenopathy:    She has no cervical adenopathy.  Neurological: She is oriented to person, place, and time. She exhibits normal muscle tone. Coordination normal.  Skin: No rash noted. No erythema.  Psychiatric: She has a normal mood and affect. Her behavior is normal.    ED Course  Procedures (including critical care time) Labs Review Labs Reviewed - No data to display  Imaging Review Dg Chest 2 View  09/10/2015  CLINICAL DATA:  Cough, dizziness, body aches, chills. EXAM: CHEST  2 VIEW COMPARISON:  None. FINDINGS: The heart size and mediastinal contours are within normal limits. Both lungs are clear. The visualized skeletal structures are unremarkable. IMPRESSION: No active cardiopulmonary disease. Electronically Signed   By: Charlett Nose M.D.   On: 09/10/2015 10:25   I have personally reviewed and evaluated these images and lab results as part of my medical decision-making.   EKG Interpretation None      MDM   Final diagnoses:  Influenza    Chest x-ray unremarkable suspect influenza patient will be treated with Tamiflu and Zofran and Motrin    Bethann Berkshire, MD 09/10/15 1051

## 2015-09-10 NOTE — ED Notes (Signed)
Pt here for evaluation of cough with body aches with occasional nausea X 1.5 days. Pt thinks she has the flu.

## 2015-09-10 NOTE — ED Notes (Signed)
Unable to obtain Iv access/blood draw.

## 2015-09-10 NOTE — Discharge Instructions (Signed)
Drink plenty of fluids take Tylenol or Motrin for pain. Follow-up this week not getting better

## 2015-10-20 ENCOUNTER — Ambulatory Visit (INDEPENDENT_AMBULATORY_CARE_PROVIDER_SITE_OTHER): Payer: Medicaid Other | Admitting: Family Medicine

## 2015-10-20 ENCOUNTER — Encounter: Payer: Self-pay | Admitting: Family Medicine

## 2015-10-20 VITALS — BP 112/82 | HR 84 | Temp 98.2°F | Ht 65.5 in | Wt 195.0 lb

## 2015-10-20 DIAGNOSIS — H538 Other visual disturbances: Secondary | ICD-10-CM | POA: Diagnosis not present

## 2015-10-20 DIAGNOSIS — R203 Hyperesthesia: Secondary | ICD-10-CM

## 2015-10-20 NOTE — Progress Notes (Signed)
   Subjective:    Patient ID: Candace Pearson, female    DOB: 1990-12-08, 25 y.o.   MRN: [id number]  HPI Monday of this week she noted left medial thigh increased sensation as well as left medial arm increased sensation, some blurred vision and she did also say that she had some slight double vision only when she would look off to the left. She also describes a Tingley sensation in her hands and feet, left greater than right. No headache, nausea, vomiting, fever, chills. [place] but states she did not do drugs and did not get drunk. She is on no medications.   Review of Systems     Objective:   Physical Exam Alert and in no distress. EOMI. Other cranial nerves grossly intact. Cerebellar testing normal. DTRs normal. Throat is clear. Neck is supple without adenopathy. Cardiac and lung exam normal.       Assessment & Plan:  Hyperesthesia  Blurred vision Reassured her that I did not find anything significant and watchful waiting would be appropriate. She was comfortable with this. Discussed the visual issues and if they get worse, she will call.

## 2016-04-11 ENCOUNTER — Encounter: Payer: Self-pay | Admitting: Medical

## 2016-04-11 ENCOUNTER — Ambulatory Visit (INDEPENDENT_AMBULATORY_CARE_PROVIDER_SITE_OTHER): Payer: Medicaid Other | Admitting: Medical

## 2016-04-11 ENCOUNTER — Other Ambulatory Visit (HOSPITAL_COMMUNITY)
Admission: RE | Admit: 2016-04-11 | Discharge: 2016-04-11 | Disposition: A | Discharge status: Home / Self Care | Payer: Medicaid Other | Source: Ambulatory Visit | Attending: Medical | Admitting: Medical

## 2016-04-11 VITALS — BP 108/62 | HR 80 | Temp 98.3°F | Ht 65.5 in | Wt 187.2 lb

## 2016-04-11 DIAGNOSIS — Z124 Encounter for screening for malignant neoplasm of cervix: Secondary | ICD-10-CM | POA: Diagnosis not present

## 2016-04-11 DIAGNOSIS — Z3009 Encounter for other general counseling and advice on contraception: Secondary | ICD-10-CM

## 2016-04-11 DIAGNOSIS — Z1151 Encounter for screening for human papillomavirus (HPV): Secondary | ICD-10-CM | POA: Insufficient documentation

## 2016-04-11 DIAGNOSIS — Z113 Encounter for screening for infections with a predominantly sexual mode of transmission: Secondary | ICD-10-CM | POA: Diagnosis not present

## 2016-04-11 DIAGNOSIS — N920 Excessive and frequent menstruation with regular cycle: Secondary | ICD-10-CM | POA: Diagnosis not present

## 2016-04-11 DIAGNOSIS — Z01419 Encounter for gynecological examination (general) (routine) without abnormal findings: Secondary | ICD-10-CM | POA: Diagnosis present

## 2016-04-11 DIAGNOSIS — Z309 Encounter for contraceptive management, unspecified: Secondary | ICD-10-CM | POA: Insufficient documentation

## 2016-04-11 LAB — CBC
HEMATOCRIT: 41.3 % (ref 35.0–45.0)
HEMOGLOBIN: 14.3 g/dL (ref 11.7–15.5)
MCH: 30.2 pg (ref 27.0–33.0)
MCHC: 34.6 g/dL (ref 32.0–36.0)
MCV: 87.1 fL (ref 80.0–100.0)
MPV: 9.4 fL (ref 7.5–12.5)
Platelets: 308 10*3/uL (ref 140–400)
RBC: 4.74 MIL/uL (ref 3.80–5.10)
RDW: 13.1 % (ref 11.0–15.0)
WBC: 8.8 10*3/uL (ref 4.0–10.5)

## 2016-04-11 NOTE — Progress Notes (Signed)
Subjective: Chief Complaint  Patient presents with  . PAP ONLY    received letter in mail stating time for PAP, no symptoms   Here for pap only.   Got notice in mail about pap, this was the only time she could come in. Physical will be deferred to later date.    She notes hx/o 3 pregnancies, 1 live birth, 2 abortions.   Not currently on birth control.  Thinks she was on birth control age 25yo, but not since then.   Sexually active.   Uses condoms and spermicides OTC.  Same partner x 6 years.   No concerns for STD screen but ok with this.  After her daughter she was interested in [employer], but it wouldn't fit.   Not sure if she has had a pap, can't recall but thinks it was with last pregnancy.   Maybe interested in other form of birth control  When it gets cold outside, will get pain at incision site.   Denies other symptom other than localized pain.  Periods are regular, sometimes heavy the first 3 days.   No breast concern.  Not doing monthly self breast exams.   Past Medical History:  Diagnosis Date  . Heartburn in pregnancy   . Hidradenitis   . Migraine    otc med prn  . Termination of pregnancy 2010, 2013   x 2  . Wears glasses     Past Surgical History:  Procedure Laterality Date  . CESAREAN SECTION N/A 12/28/2013   Procedure: CESAREAN SECTION;  Surgeon: Michael LitterNaima A Dillard, MD;  Location: [facility];  Service: Obstetrics;  Laterality: N/A;  . HYDRADENITIS EXCISION Left 03/31/2014   Procedure: EXCISION LEFT AXILLA HIDRADENITIS ;  Surgeon: Harriette Bouillonhomas Cornett, MD;  Location: [facility];  Service: General;  Laterality: Left;    Social History   Social History  . Marital status: Single    Spouse name: N/A  . Number of children: N/A  . Years of education: N/A   Occupational History  . Not on file.   Social History Main Topics  . Smoking status: Former Smoker    Packs/day: 0.10    Years: 8.00    Types: Cigarettes    Quit date: 03/15/2013  . Smokeless tobacco: Never Used  . Alcohol  use No     Comment: marijuana in the past  . Drug use: No     Comment: last use 03/2013  . Sexual activity: Yes    Birth control/ protection: None     Comment: Pregnant   Other Topics Concern  . Not on file   Social History Narrative   Lives with mother-in-law, fiance and baby girl infant born in June 2015;  Full time student [employer], criminal justice and sociology, exercises.       Family History  Problem Relation Age of Onset  . Cancer Neg Hx   . Heart disease Neg Hx   . Diabetes Neg Hx   . Stroke Neg Hx   . Hypertension Neg Hx      Current Outpatient Prescriptions:  .  BENZACLIN gel, as needed., Disp: , Rfl: 1 .  ibuprofen (ADVIL,MOTRIN) 200 MG tablet, Take 200 mg by mouth every 6 (six) hours as needed., Disp: , Rfl:   Allergies  Allergen Reactions  . Monosodium Glutamate Hives and Swelling    Lips swell    ROS as in subjective   Objective: BP 108/62 (BP Location: Left Arm, Patient Position: Sitting, Cuff Size: Normal)  Pulse 80   Temp 98.3 F (36.8 C) (Oral)   Ht 5' 5.5" (1.664 m)   Wt 187 lb 3.2 oz (84.9 kg)   LMP 03/15/2016 (Exact Date)   SpO2 98%   BMI 30.68 kg/m   Gen: wd, wn, nad Breast: nontender, no masses or lumps, no skin changes, no nipple discharge or inversion, no axillary lymphadenopathy Gyn: Normal external genitalia without lesions, vagina with normal mucosa, cervix without lesions, no cervical motion tenderness, no abnormal vaginal discharge.  Uterus and adnexa not enlarged, nontender, no masses.  Pap performed.  Exam chaperoned by nurse. Rectal: anus normal appearing     Assessment: Encounter Diagnoses  Name Primary?  . Encounter for other general counseling or advice on contraception Yes  . Screening for cervical cancer   . Menorrhagia with regular cycle   . Screen for STD (sexually transmitted disease)     Plan: Counseled on safe sex, contraception, types of contraception, risks/benefits of the types of  contraception.   C/t condoms and spermicide use.  She will consider options.   Labs today.  counseled on self breast exams.   F/u at her convenience for full physical.  Anissia was seen today for pap only.  Diagnoses and all orders for this visit:  Encounter for other general counseling or advice on contraception -     RPR -     HIV antibody -     Cytology - PAP -     CBC -     POCT urine pregnancy  Screening for cervical cancer -     RPR -     HIV antibody -     Cytology - PAP -     CBC -     POCT urine pregnancy  Menorrhagia with regular cycle -     CBC  Screen for STD (sexually transmitted disease) -     RPR -     HIV antibody -     Cytology - PAP -     CBC

## 2016-04-12 LAB — RPR

## 2016-04-12 LAB — CYTOLOGY - PAP

## 2016-04-12 LAB — HIV ANTIBODY (ROUTINE TESTING W REFLEX): HIV 1&2 Ab, 4th Generation: NONREACTIVE

## 2016-07-20 IMAGING — DX DG CHEST 2V
3 series · 3 of 3 positions shown · non-contrast
Comparison: None.

CLINICAL DATA: Cough, dizziness, body aches, chills.

EXAM:
CHEST  2 VIEW

[chest pa]
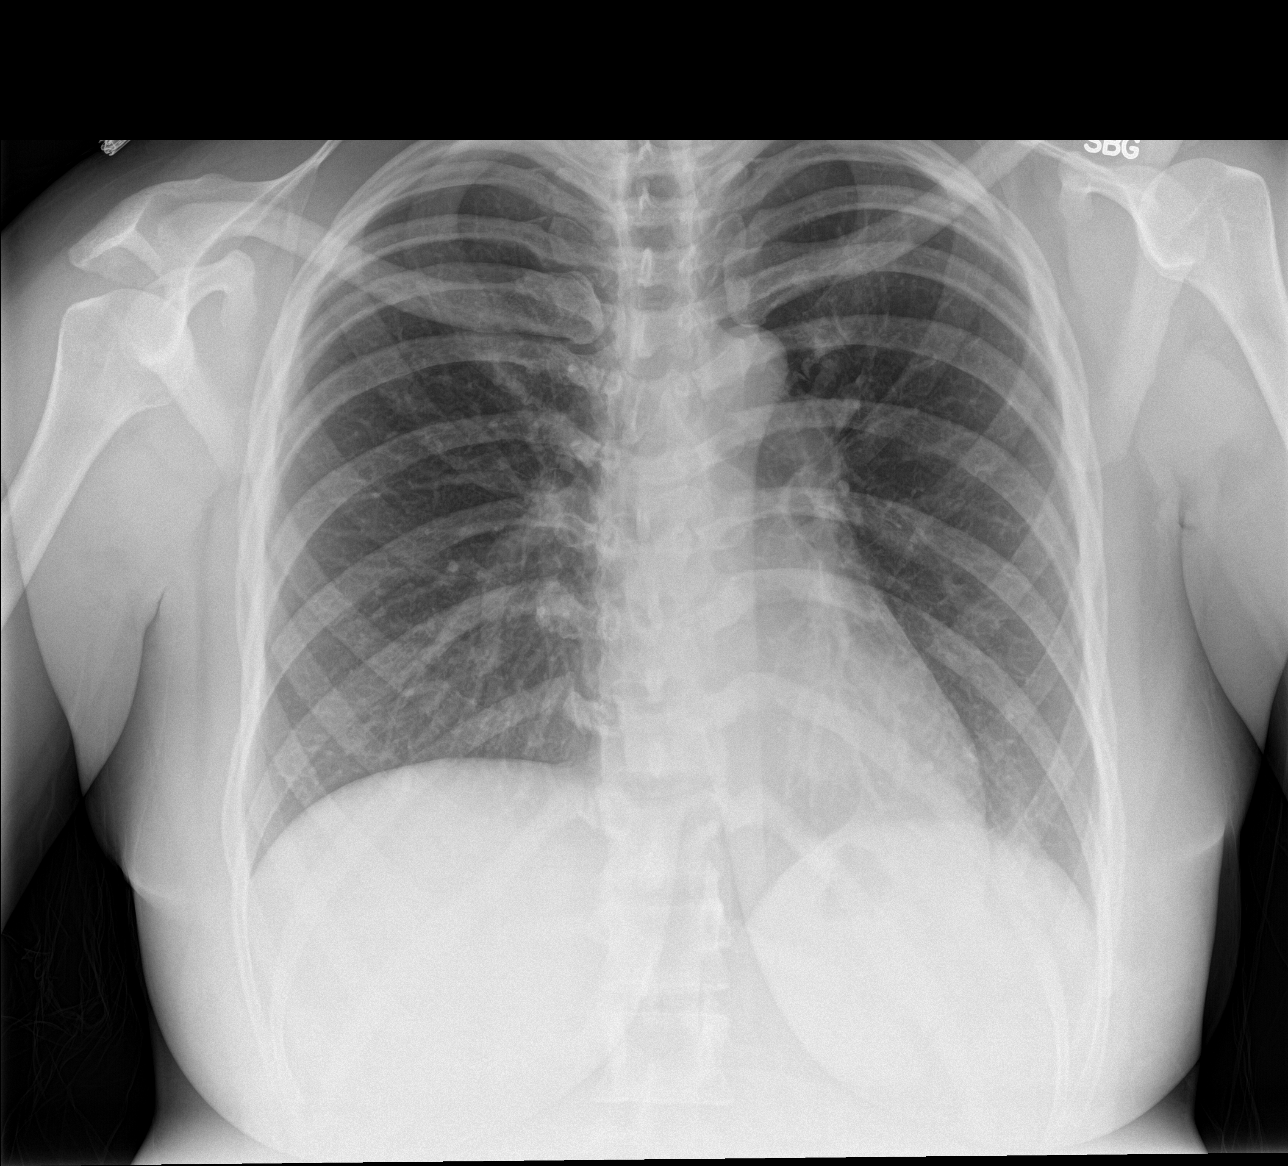

[chest lat (1 of 2)]
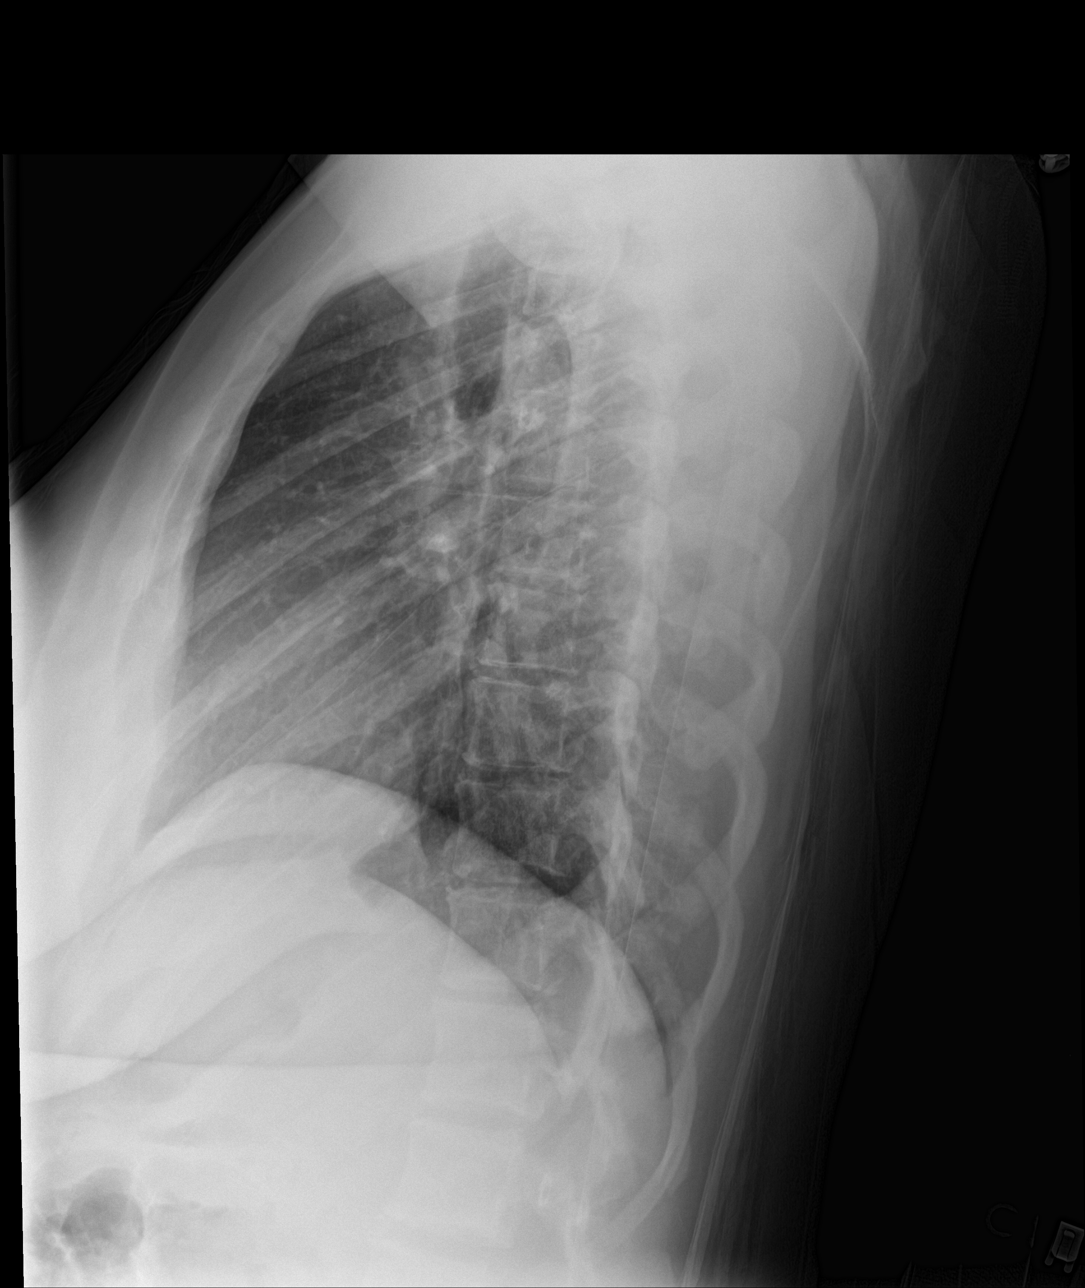

[chest lat (2 of 2)]
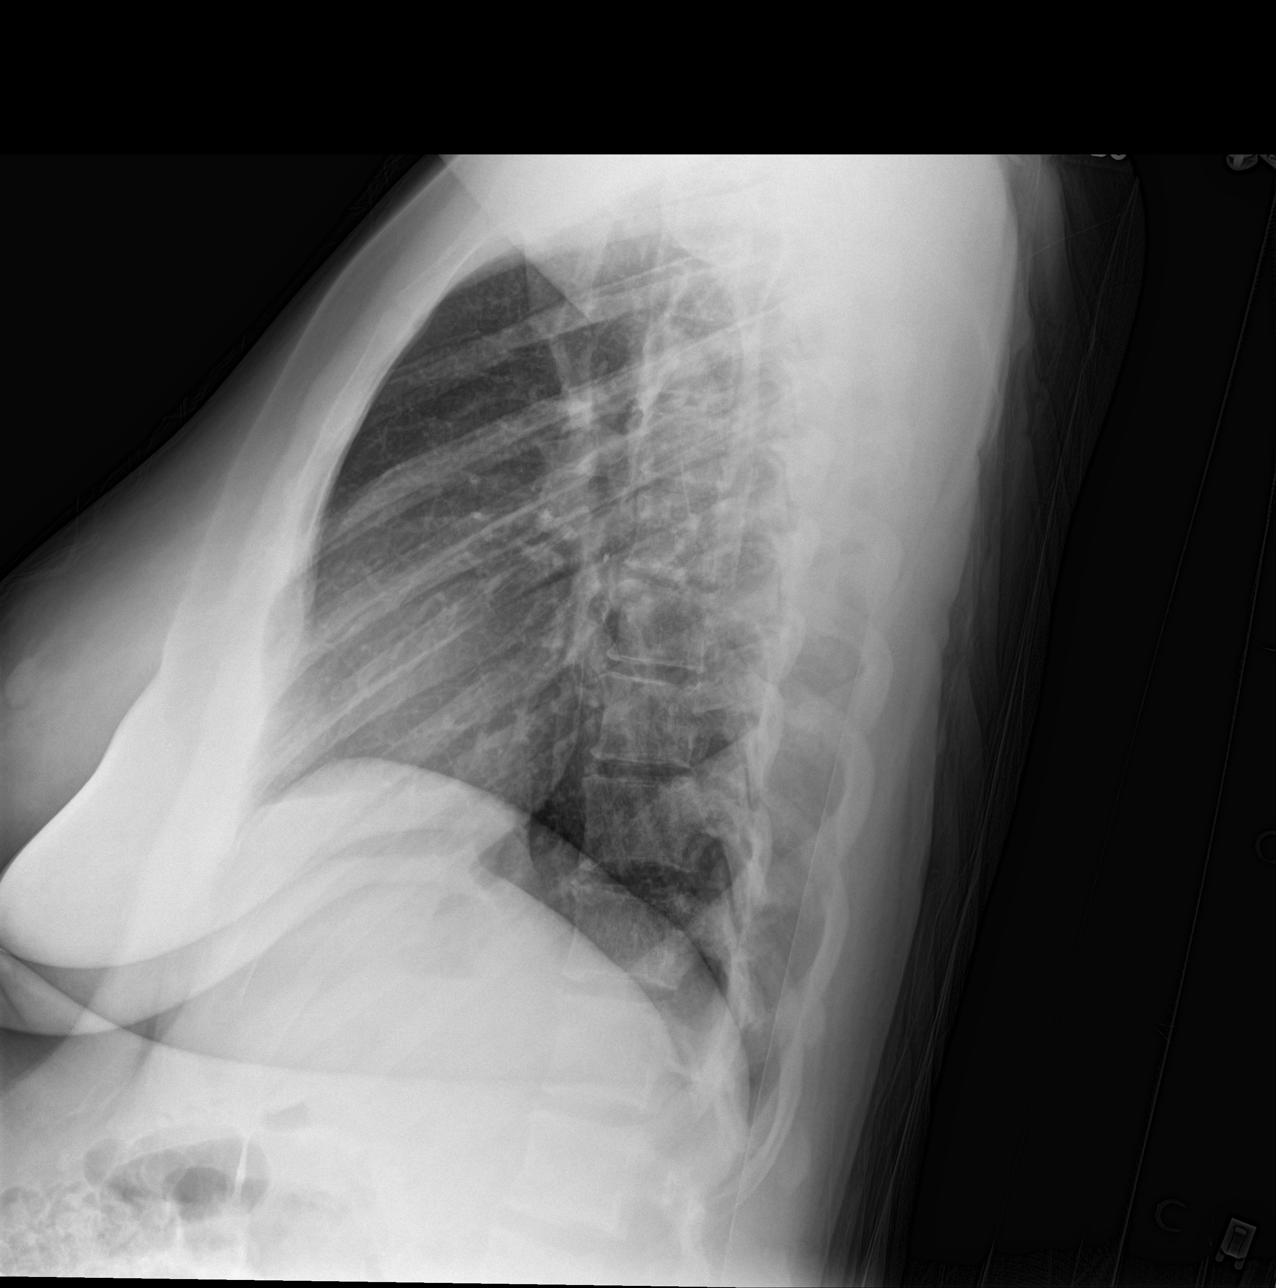

[3 of 3 positions shown; findings below may reference images not displayed]

FINDINGS: The heart size and mediastinal contours are within normal limits.
Both lungs are clear. The visualized skeletal structures are
unremarkable.
IMPRESSION: No active cardiopulmonary disease.
